# Patient Record
Sex: Female | Born: 1935 | Race: White | Hispanic: No | Smoking: Never smoker
Health system: Southern US, Community
[De-identification: ages and names within clinical notes are randomized; demographics above are authoritative.]

## PROBLEM LIST (undated history)

## (undated) DIAGNOSIS — G629 Polyneuropathy, unspecified: Secondary | ICD-10-CM

## (undated) DIAGNOSIS — R05 Cough: Secondary | ICD-10-CM

## (undated) DIAGNOSIS — R569 Unspecified convulsions: Secondary | ICD-10-CM

## (undated) DIAGNOSIS — I5181 Takotsubo syndrome: Secondary | ICD-10-CM

## (undated) DIAGNOSIS — G40909 Epilepsy, unspecified, not intractable, without status epilepticus: Secondary | ICD-10-CM

## (undated) DIAGNOSIS — K219 Gastro-esophageal reflux disease without esophagitis: Secondary | ICD-10-CM

## (undated) DIAGNOSIS — F419 Anxiety disorder, unspecified: Secondary | ICD-10-CM

## (undated) DIAGNOSIS — I219 Acute myocardial infarction, unspecified: Secondary | ICD-10-CM

## (undated) DIAGNOSIS — I10 Essential (primary) hypertension: Secondary | ICD-10-CM

## (undated) DIAGNOSIS — Z8709 Personal history of other diseases of the respiratory system: Secondary | ICD-10-CM

## (undated) DIAGNOSIS — R059 Cough, unspecified: Secondary | ICD-10-CM

## (undated) DIAGNOSIS — R011 Cardiac murmur, unspecified: Secondary | ICD-10-CM

## (undated) DIAGNOSIS — Z87440 Personal history of urinary (tract) infections: Secondary | ICD-10-CM

## (undated) DIAGNOSIS — D649 Anemia, unspecified: Secondary | ICD-10-CM

## (undated) HISTORY — PX: CATARACT EXTRACTION W/ INTRAOCULAR LENS IMPLANT: SHX1309

## (undated) HISTORY — PX: CHOLECYSTECTOMY: SHX55

## (undated) HISTORY — PX: ABDOMINAL HYSTERECTOMY: SHX81

## (undated) HISTORY — DX: Essential (primary) hypertension: I10

## (undated) HISTORY — PX: BREAST LUMPECTOMY: SHX2

## (undated) HISTORY — DX: Gastro-esophageal reflux disease without esophagitis: K21.9

## (undated) HISTORY — PX: LAPAROSCOPIC ABDOMINAL EXPLORATION: SHX6249

## (undated) HISTORY — DX: Takotsubo syndrome: I51.81

## (undated) HISTORY — DX: Polyneuropathy, unspecified: G62.9

## (undated) HISTORY — DX: Epilepsy, unspecified, not intractable, without status epilepticus: G40.909

## (undated) HISTORY — DX: Anxiety disorder, unspecified: F41.9

## (undated) HISTORY — PX: CARPAL TUNNEL RELEASE: SHX101

## (undated) HISTORY — PX: TONSILLECTOMY: SUR1361

---

## 2000-03-05 ENCOUNTER — Other Ambulatory Visit: Admission: RE | Admit: 2000-03-05 | Discharge: 2000-03-05 | Payer: Self-pay | Admitting: Gastroenterology

## 2000-03-05 ENCOUNTER — Encounter (INDEPENDENT_AMBULATORY_CARE_PROVIDER_SITE_OTHER): Payer: Self-pay | Admitting: Specialist

## 2004-05-23 ENCOUNTER — Ambulatory Visit (HOSPITAL_COMMUNITY): Admission: RE | Admit: 2004-05-23 | Discharge: 2004-05-23 | Payer: Self-pay | Admitting: Gastroenterology

## 2004-06-27 ENCOUNTER — Ambulatory Visit (HOSPITAL_COMMUNITY): Admission: RE | Admit: 2004-06-27 | Discharge: 2004-06-27 | Payer: Self-pay | Admitting: Gastroenterology

## 2004-07-20 ENCOUNTER — Emergency Department (HOSPITAL_COMMUNITY): Admission: EM | Admit: 2004-07-20 | Discharge: 2004-07-20 | Payer: Self-pay | Admitting: Emergency Medicine

## 2005-08-16 IMAGING — CR DG ABDOMEN ACUTE W/ 1V CHEST
3 series · 3 of 3 positions shown · non-contrast
Comparison: none

CLINICAL DATA: Nausea/vomiting, short of breath.
 ACUTE ABDOMINAL SERIES
 The lungs are hyperinflated compatible with COPD.  The lungs are clear.
 Flat and upright views of the abdomen show normal bowel gas pattern without obstruction or free air.  There are no renal calculi.
 IMPRESSION 
 COPD.  No active disease.  
 Negative abdomen.

[view not recorded (1 of 3)]
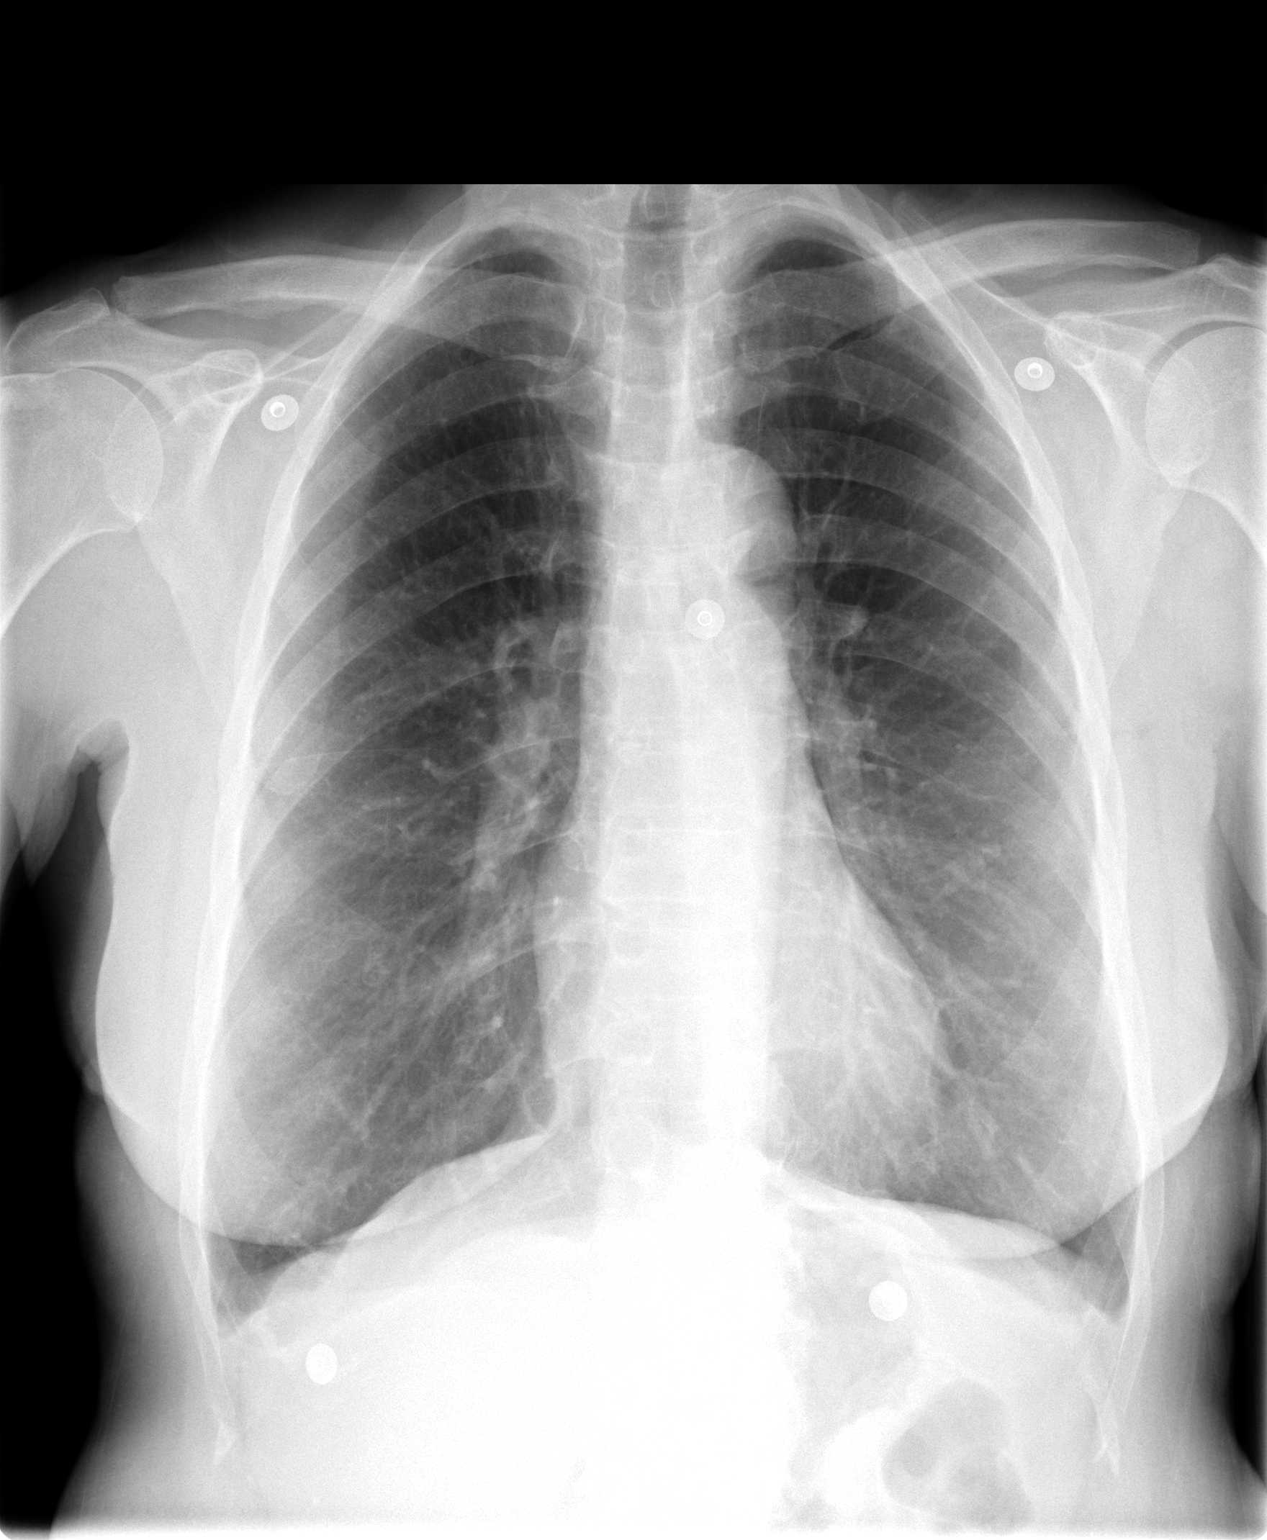

[view not recorded (2 of 3)]
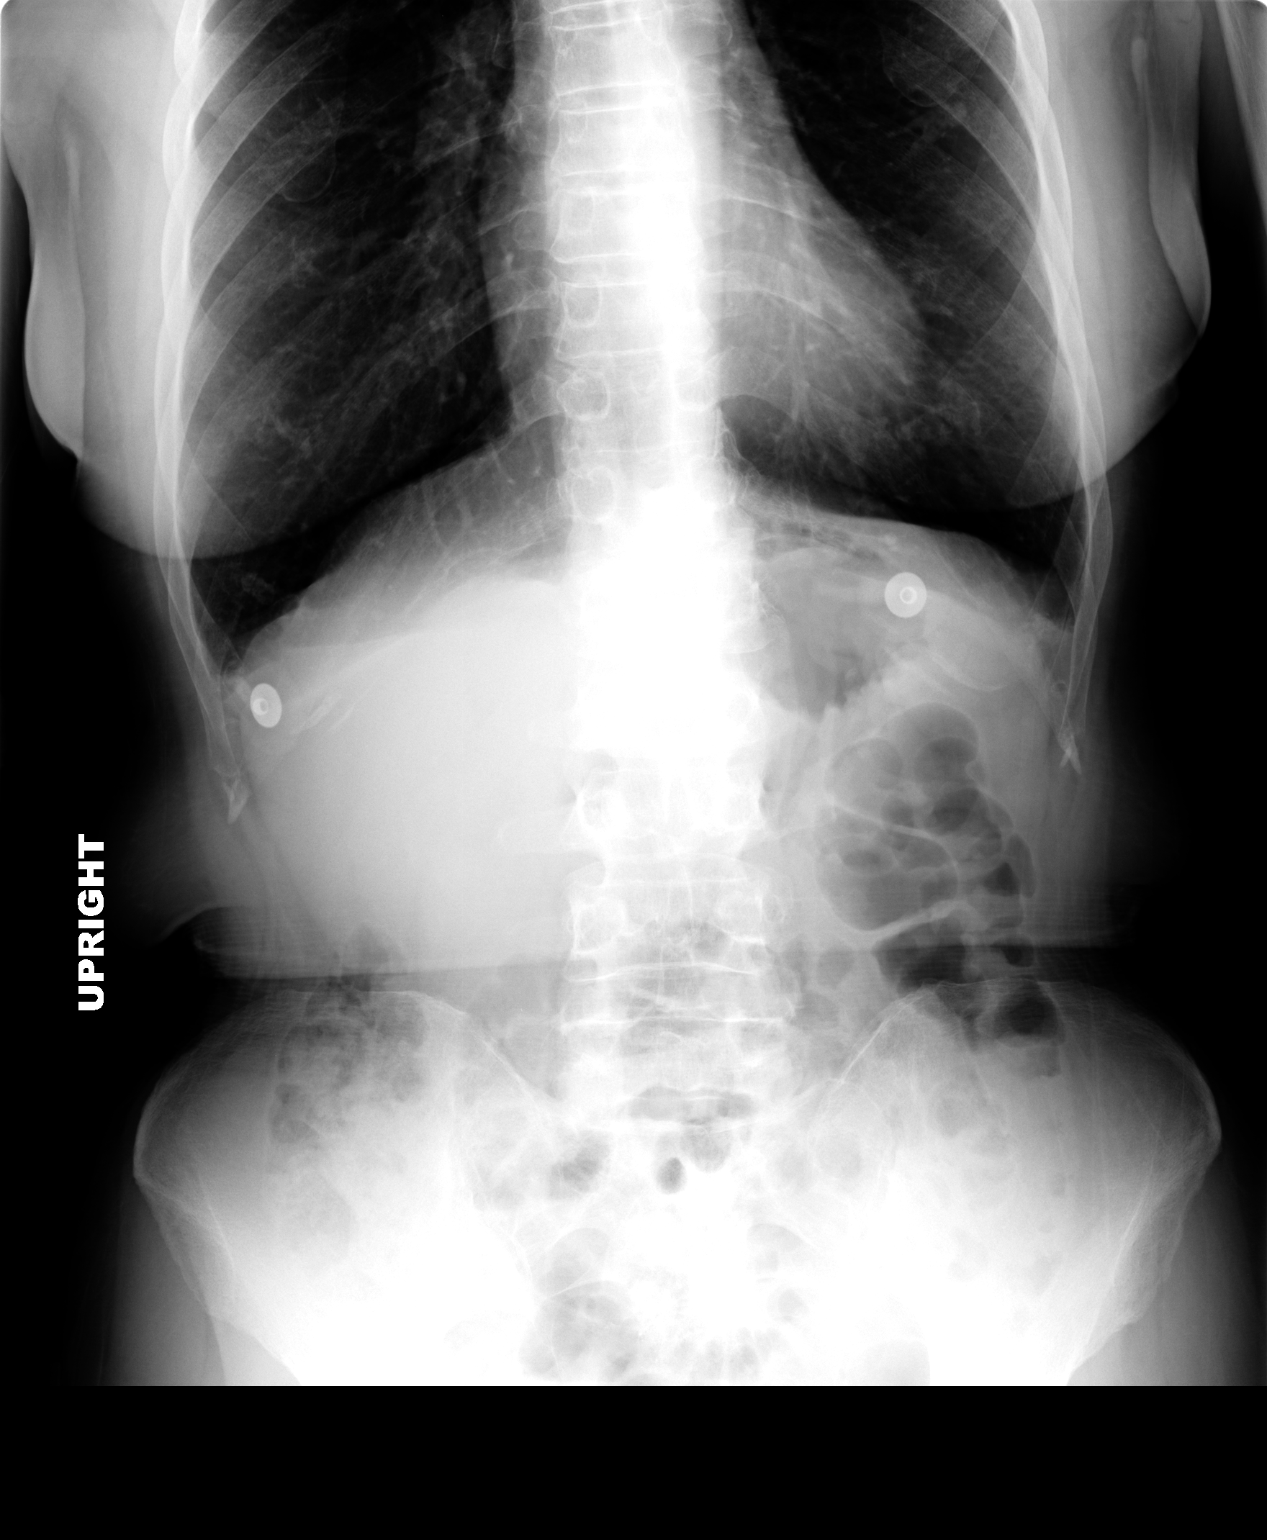

[view not recorded (3 of 3)]
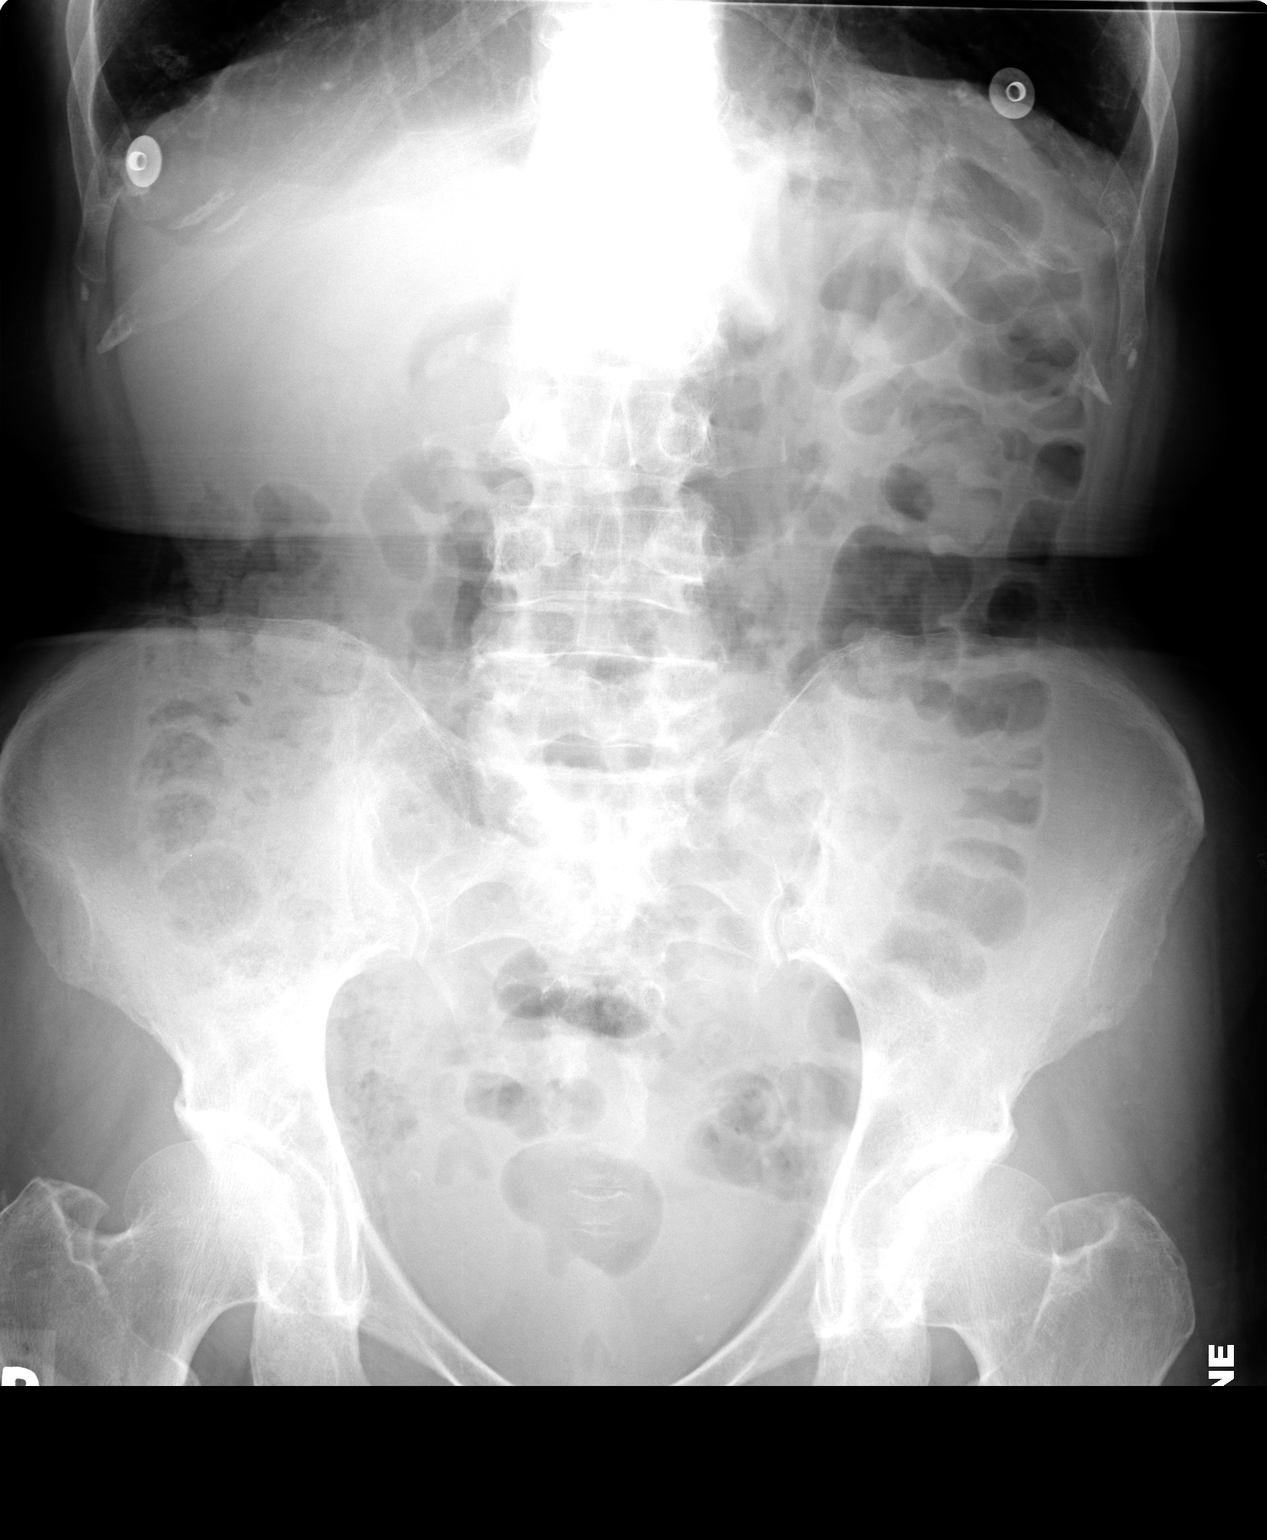

[3 of 3 positions shown; findings below may reference images not displayed]

## 2008-11-12 HISTORY — PX: CARDIAC CATHETERIZATION: SHX172

## 2010-01-20 ENCOUNTER — Ambulatory Visit: Payer: Self-pay | Admitting: Cardiovascular Disease

## 2010-02-09 ENCOUNTER — Ambulatory Visit (HOSPITAL_COMMUNITY): Admission: RE | Admit: 2010-02-09 | Discharge: 2010-02-09 | Payer: Self-pay | Admitting: Cardiovascular Disease

## 2010-02-09 ENCOUNTER — Ambulatory Visit: Payer: Self-pay | Admitting: Cardiology

## 2010-02-09 ENCOUNTER — Encounter: Payer: Self-pay | Admitting: Cardiovascular Disease

## 2010-02-09 ENCOUNTER — Ambulatory Visit: Payer: Self-pay

## 2010-02-15 ENCOUNTER — Telehealth: Payer: Self-pay | Admitting: Cardiovascular Disease

## 2010-02-21 ENCOUNTER — Ambulatory Visit: Payer: Self-pay | Admitting: Cardiovascular Disease

## 2010-03-30 ENCOUNTER — Ambulatory Visit: Payer: Self-pay | Admitting: Cardiovascular Disease

## 2010-05-04 ENCOUNTER — Ambulatory Visit: Payer: Self-pay | Admitting: Cardiovascular Disease

## 2010-08-08 ENCOUNTER — Ambulatory Visit: Payer: Self-pay | Admitting: Cardiovascular Disease

## 2010-12-05 ENCOUNTER — Ambulatory Visit
Admission: RE | Admit: 2010-12-05 | Discharge: 2010-12-05 | Payer: Self-pay | Source: Home / Self Care | Attending: Cardiovascular Disease | Admitting: Cardiovascular Disease

## 2010-12-06 NOTE — Assessment & Plan Note (Signed)
[facility]                       [place] CARDIOLOGY OFFICE NOTE  LAURAN, ROMANSKI                    MRN:          [id number] DATE:12/05/2010                            DOB:          22-May-1927   Mr. [name] is an 77 year old female who is here for followup visit. She has the following problem list: 1. Takotsubo cardiomyopathy in May of 2010.  Ejection fraction has     normalized since then. 2. Hypertension. 3. Seizure disorder. 4. Gastroesophageal reflux disease. 5. Anxiety. 6. Peripheral neuropathy.  INTERVAL HISTORY:  The patient is here today for a followup visit. Overall, she has been doing very well from a cardiac standpoint.  There has been no chest pain, dizziness, syncope, or presyncope.  There has been a concern recently about possibility that her amlodipine is causing constipation.  PHYSICAL EXAMINATION:  VITAL SIGNS:  Weight is 130.6 pounds, blood pressure is 141/71, pulse is 68, oxygen saturation is 95% on room air. NECK:  Reveals no JVD or carotid bruits. LUNGS:  Clear to auscultation. HEART:  Regular rate and rhythm with no gallops or murmurs. ABDOMEN:  Benign, nontender, nondistended. EXTREMITIES:  With no clubbing, cyanosis, or edema.  IMPRESSION: 1. Status post stress-induced cardiomyopathy or Takotsubo syndrome in     May of 2010.  This has resolved since then.  Most recent ejection     fraction was normal.  We will continue with daily aspirin and blood     pressure control. 2. Hypertension:  Blood pressure is reasonably controlled.  There is a     concern that her amlodipine might be causing constipation.  Thus,     we will stop the medication and switch her to carvedilol 3.125 mg     twice daily.  We will continue with Micardis 40 mg once daily.  The     patient will follow up in 6 months from now or earlier if needed.    Lorine Bears, MD Electronically Signed   MA/MedQ  DD: 12/05/2010  DT: 12/06/2010   Job #: [id number]

## 2010-12-14 NOTE — Progress Notes (Signed)
  Phone Note Outgoing Call   Call placed by: Dessie Coma LPN Call placed to: Patient Summary of Call: Patient notified per Dr. Freida Busman, Echo showed heart structure and function was normal.

## 2011-03-27 NOTE — Assessment & Plan Note (Signed)
[facility]                        [place] CARDIOLOGY OFFICE NOTE   GENIFER, LAZENBY                    MRN:          [id number]  DATE:01/20/2010                            DOB:          May 01, 1927    CHIEF COMPLAINT:  Establishing cardiovascular care.   HISTORY OF PRESENT ILLNESS:  Ms. Oddo is an 77 year old white  female, past medical history significant for possible myocardial  infarction in May 2010, seizure disorder, hypertension, GERD, who is  presenting to change cardiovascular care.  The patient states that in  May of 2010, she had a closed head injury after falling and striking her  head on a table.  She did not lose consciousness.  When she reported to  the emergency room, she was told that she had a myocardial infarction.  She underwent a left heart catheterization at [facility] which  showed minor irregularities of the coronaries and apical hypokinesis  that may be secondary to Takotsubo syndrome.  Since that time, the  patient had a grand mal seizure and is currently under treatment by a  neurologist for such.  She states that she has chronic mild tightness in  her chest that does not go away.  She denies any other chest pain or  chest discomfort other than this chronic sensation.  She also endorses  some dyspnea on exertion but denies any PND or orthopnea.  She has had  multiple deaths in family members and is under considerable amount of  stress for this reason.  She is compliant with her medications but  states the desire to decrease the number of medications that she is  taking.   PAST MEDICAL HISTORY:  As above in HPI.   SOCIAL HISTORY:  No tobacco, no alcohol.   FAMILY HISTORY:  Negative for premature coronary artery disease.   ALLERGIES:  PENICILLIN, CODEINE, and SULFA.   MEDICATIONS:  1. Aspirin 162 mg daily.  2. Amlodipine 2.5 mg daily.  3. Micardis 40 mg daily.  4. Paxil.  5. Lyrica.  6.  Ranitidine.  7. Nexium.  8. Ambien.  9. Clonazepam.  10.Mucinex.  11.Premarin 0.3 mg daily.  12.Glucosamine.  13.Keppra.  14.Estrace.  15.Fish oil.  16.Macrodantin.  17.Amitiza.  18.Multivitamin.   REVIEW OF SYSTEMS:  Positive for headaches, lightheadedness, occasional  dizziness, high blood pressure.  Other systems as in HPI, otherwise  negative.   PHYSICAL EXAMINATION:  VITAL SIGNS:  Her blood pressure is 134/67 in the  right arm and 149/80 in the left arm, pulse is 77, and she weighs 124  pounds.  GENERAL:  No acute distress.  HEENT:  Normocephalic, atraumatic.  NECK:  Supple.  There is no JVD.  There are no carotid bruits.  HEART:  Regular rate and rhythm without murmur, rub, or gallop.  LUNGS:  Clear bilaterally.  ABDOMEN:  Soft, nontender, nondistended.  EXTREMITIES:  Without edema.  SKIN:  Warm and dry.  Pulses 2+ bilateral radial and posterior tibial  pulses.  PSYCHIATRIC:  The patient is tearful, but appropriate with normal levels  of insight.  MUSCULOSKELETAL:  The patient has 5/5 bilateral upper  and lower  extremity strength.   EKG taken today in the office demonstrates normal sinus rhythm with  delayed R-wave progression and evidence of an old anteroseptal  infarction.  Review of the patient's heart catheterization as above in  HPI.  Review of the patient's carotid duplex study shows 139% stenosis  bilaterally, that is dated May 2010.  Review of the patient's labs,  BMP  had a sodium of 138, potassium 4.2, chloride 101, CO2 26, BUN 11,  creatinine 0.8, glucose 80, total cholesterol 210, HDL 65, triglyceride  95, LDL 126.   ASSESSMENT AND PLAN:  1. History of myocardial infarction.  I currently do not have labs to      confirm this.  Her heart catheterization in May showed no evidence      of coronary disease, but did show some apical hypokinesis.  An      ejection fraction was not commented upon.  She should continue on      aspirin 162 mg daily.  She is  not having any angina currently.  She      does have some chronic chest tightness that may be related to      anxiety.  The patient has been on Premarin and we recommend she      discuss a trial off the Premarin with her OB/GYN as it likely is      increasing her risk for thrombosis.  2. Shortness of breath.  The patient does endorse some dyspnea on      exertion that may be appropriate for age or maybe related to      systolic or diastolic dysfunction.  We will check a transthoracic      echocardiogram for further evaluation.  3. Hypertension.  We asked that she check her blood pressure twice a      day at home and report this to [place].  She should continue the above      antihypertensives for now.  4. Lipids.  The patient's LDL was 126.  It appears she does not have a      history significant coronary disease and I see no need for statin      therapy at this time.   We will see the patient back after her echocardiogram and her other  medical records arrive.     Brayton El, MD  Electronically Signed    SGA/MedQ  DD: 01/20/2010  DT: 01/21/2010  Job #: [id number]

## 2011-03-27 NOTE — Assessment & Plan Note (Signed)
[place] [facility]                         CARDIOLOGY OFFICE NOTE   [name], [name]                    MRN:          [id number]  DATE:02/21/2010                            DOB:          1926/12/06    PROBLEM LIST:  1. Possible history of a Takotsubo cardiomyopathy, now resolved.  2. Hypertension.  3. Seizure.  4. Gastroesophageal reflux disease.   INTERVAL HISTORY:  The patient states since her last visit she has been  getting along fairly well.  She occasionally gets epigastric discomfort;  however, it is self-limiting.  She has been compliant with her  medications.  Her main complaint seems to be some burning in her mouth.  She was started on Paxil for this symptom and may try Neurontin if the  Paxil does not work.  She is active during the day and is able to  perform all of her daily activities without any significant difficulty.   PHYSICAL EXAMINATION:  VITAL SIGNS:  On physical exam, blood pressure in  the right arm is 133/67, left arm 148/67; pulse is 71, saturating 95% on  room air, she weighs 126 pounds.  GENERAL:  No acute distress.  HEENT:  Normocephalic, atraumatic.  HEART:  Regular rate and rhythm without murmur, rub, or gallop.  LUNGS:  Clear bilaterally.  ABDOMEN:  Soft, nontender, nondistended.  EXTREMITIES:  Without edema.  SKIN:  Warm and dry.   Review of the patient's echocardiogram report dated February 09, 2010  ejection fraction of 55-60%.  There were no regional wall motion  abnormalities.  There was diastolic dysfunction, mitral valve prolapse  along the anterior and posterior leaflet per the report associated with  mild regurgitation.   ASSESSMENT AND PLAN:  1. History of Takotsubo.  She currently has normal ejection fraction      and normal wall motion.  She also does not having any signs of      heart failure.  She should continue on Micardis and Nexium.  Of      note, she had a heart catheterization around  the time of Takotsubo      which showed no obstructive coronary disease.  This was on Mar 14, 2009.  The patient was told that she had a myocardial infarction      around this time and it is possible she had a small troponin bump      from the Takotsubo.  She should continue on aspirin dose of least      81 mg daily.  The patient can followup in 6 months' time.  2. Hypertension.  Blood pressure minimally elevated today.  The      patient will bring in her blood pressure log from home, first      evaluate, she may need titration up from Micardis or the      amlodipine.  3. Lipids.  Her lipid profile was acceptable since she has no      significant coronary disease and      LDL of 126, HDL 65, triglyceride 95, total cholesterol of 210.  The  patient may try red yeast rice at home.     Brayton El, MD  Electronically Signed    SGA/MedQ  DD: 02/21/2010  DT: 02/22/2010  Job #: [id number]

## 2011-03-27 NOTE — Assessment & Plan Note (Signed)
[facility]                        [place] CARDIOLOGY OFFICE NOTE   [name], [name]                    MRN:          [id number]  DATE:05/04/2010                            DOB:          July 13, 1927    PROBLEMS LIST:  1. Possible history of Takotsubo cardiomyopathy, now resolved.  2. Hypertension.  3. Seizure.  4. Gastroesophageal reflux disease.   INTERVAL HISTORY:  The patient comes in today with 2 episodes of chest  discomfort.  She states that approximately 1 week and half ago, she had  a acute onset of sharp chest discomfort lasting only seconds without  radiation or associated symptoms.  It was self-limiting.  Approximately  10 days after that episode, she had a subsequent episode again sharp in  nature, lasting only a few seconds without radiation or associated  symptoms.  Other than that, she has been in her normal state of health.  She continues to endorse some burning in her lower extremities for which  she is being treated with Lyrica.  She states that the burning in her  mouth has improved since the Benadryl, nystatin, lidocaine mixture we  prescribed at her last visit.  The patient's daughter does endorse one  episode of the patient repeating herself several times in a row 1 day,  but this has not reoccurred.   PHYSICAL EXAMINATION:  VITAL SIGNS:  Her blood pressure is 146/65, pulse  69, satting 96% on room air, and she weighs 130 pounds.  GENERAL:  No acute distress.  HEENT:  Normocephalic, atraumatic.  NECK:  Supple.  HEART:  Regular rate and rhythm without murmur, rub, or gallop.  LUNGS:  Clear bilaterally.  ABDOMEN:  Soft, nontender, nondistended.  EXTREMITIES:  Without edema.  SKIN:  Cool and dry.   Review of the patient's blood pressure log from home show systolic blood  pressures generally in the high 130s, low 140s, and diastolic blood  pressures ranging from 71-87.  Her heart rate ranging from 59-70.   ASSESSMENT  AND PLAN:  At this point, I am not overly concerned with the  patient's chest discomfort.  It has only occurred twice and has lasted  only a few seconds each time.  The patient is told that if the episodes  were to become more frequent, more intense, or the quality of them were  to change, she should contact our office and we would probably proceed  with a CT scan of the chest to rule out any type of potential vascular  dissection or pulmonary embolism.  Reviewing the patient's blood  pressure log, her blood pressure seems to be at an ideal level  especially considering the fact that she has orthostatic hypotension.  Her blood pressure medication should continue as before.  Lastly, if the  patient has anymore episodes of  forgetfulness or repeating herself, they we will contact her primary  care physician Dr. Sherral Hammers for further evaluation of this issue.  Today,  we will refill her swish and spit prescription.     Brayton El, MD  Electronically Signed    SGA/MedQ  DD: 05/04/2010  DT:  05/05/2010  Job #: [id number]   cc:   Keturah Barre, MD

## 2011-03-27 NOTE — Assessment & Plan Note (Signed)
[place] [facility]                        [place] CARDIOLOGY OFFICE NOTE   STACY, SAILER                    MRN:          [id number]  DATE:03/30/2010                            DOB:          12-Mar-1927    PROBLEM LIST:  1. Possible history of takotsubo cardiomyopathy now resolved.  2. Hypertension.  3. Seizure.  4. GERD.   INTERVAL HISTORY:  The patient comes in today complaining of worsening  in dizziness, especially when she stands.  She also is complaining of  worsening of burning in her mouth.  She has noticed no fever blisters.  The patient states the dizziness occurs generally when she stands or if  she moves her head up and leans back even a little bit.  She denies any  chest discomfort and has been compliant with her other medicines.  She  brings in a blood pressure log today that shows her blood pressures at  home have been well controlled often with systolics in the 120s or 110s.   PHYSICAL EXAMINATION:  VITAL SIGNS:  Today, her blood pressure is  155/76, pulse 64, satting 96% on room air, and weigh is 127 pounds.  Orthostatics taken today in the office, initially heart rate was 69,  blood pressure 182/80.  Upon standing, heart rates stayed at 69, blood  pressure decreased to 149/72.  At 5 minutes blood pressure increased to  154/79, heart rate maintaining at 67.  GENERAL:  No acute distress.  HEENT:  Normocephalic, atraumatic.  NECK:  Supple.  HEART:  Regular rate and rhythm without murmur, rub, or gallop.  LUNGS:  Clear bilaterally.  ABDOMEN:  Soft, nontender, nondistended.  EXTREMITIES:  Without edema.  SKIN:  Warm and dry.   ASSESSMENT AND PLAN:  1. Dizziness.  The patient is encouraged to increase her p.o. intake.      Orthostasis may be playing a role; however, her blood pressure      remains elevated.  For now, she will continue on the Micardis and      amlodipine.  She will bring in her home blood pressure monitoring    device at some point so we can calibrate it with our machine to      assure it is accurate.  She will also take her blood pressures at      home consistently twice a day for several days and let us know what      this shows.  In the future, it may be necessary to stop the      amlodipine if indeed her blood pressure is as well controlled as      her current, intermittent log demonstrates.  2. Mouth burning.  She has seen several dentists for this in the past      and has not been able to find an effective treatment for this.  On      exam today, her dentures are in place but there is no clear thrush      or lesion.  I have called in a prescription of Magic mouthwash and      have  specifically asked for Benadryl, nystatin, and lidocaine to be      placed in the solution.  She should use at 5 mL swish and spit      twice a day or more frequently as needed.     Brayton El, MD  Electronically Signed    SGA/MedQ  DD: 03/30/2010  DT: 03/31/2010  Job #: [id number]   cc:   Keturah Barre, M.D.

## 2011-03-27 NOTE — Assessment & Plan Note (Signed)
[name]                        [place] CARDIOLOGY OFFICE NOTE   [name], [name]                    MRN:          [id number]  DATE:08/08/2010                            DOB:          12-Dec-1926    This is a followup visit for [name] who is an 77 year old female  with the following problem list:  1. Takotsubo cardiomyopathy in May 2010.  Her ejection fraction has      normalized after that.  2. Hypertension.  3. Seizure.  4. Gastroesophageal reflux disease.   INTERVAL HISTORY:  The patient continues to have brief, but rare  episodes of intermittent chest discomfort lasting for a few seconds.  She has chronic exertional dyspnea.  She has multiple other complaints  that include numbness in her feet and hands.  Her blood pressure has  been well controlled.  She has not had any new seizure activities.  She  is on multiple medications for chronic medical conditions.   PHYSICAL EXAMINATION:  VITAL SIGNS:  Weight is 125.2 pounds, blood  pressure is 129/77, pulse is 70, oxygen saturation 97% on room air.  NECK:  No JVD or carotid bruits.  LUNGS:  Clear to auscultation.  HEART:  Regular rate and rhythm with no gallops or murmurs.  ABDOMEN:  Benign, nontender, nondistended.  EXTREMITIES:  No clubbing, cyanosis or edema.   1. Status post stress-induced cardiomyopathy or takotsubo syndrome      which happened in May 2010.  I did review some of the images of her      cardiac catheterization.  Her left ventriculogram was classic for      takotsubo cardiomyopathy.  It appears that her coronary arteries      were normal.  She did have a followup echocardiogram since then      which showed normalization of her LV systolic function.  At this      time, I recommend continuing daily aspirin and blood pressure      control.  She does have a lot of anxiety symptoms which is being      treated.  I think the precipitating factor last year was illness  of      her son who was battling long-term colon cancer.  He actually died      last year.  I asked her to discuss if she can come off Premarin      given her cardiac history.  2. Hypertension.  Blood pressure is well controlled.  We will continue      with Micardis and amlodipine.  3. Social issues.  The patient seems to be under stress as she is      going to be living by herself in the near future.  Her daughter      will be moving out when her kids start school.  She does not want      to consider an assisted-living situation.  We will continue to      monitor.  She will follow up with me in 4 months from now or      earlier if needed.  Lorine Bears, MD  Electronically Signed   MA/MedQ  DD: 08/08/2010  DT: 08/09/2010  Job #: [id number]

## 2011-05-31 ENCOUNTER — Encounter: Payer: Self-pay | Admitting: Cardiovascular Disease

## 2011-06-04 ENCOUNTER — Ambulatory Visit (INDEPENDENT_AMBULATORY_CARE_PROVIDER_SITE_OTHER): Payer: Medicare Other | Admitting: Cardiovascular Disease

## 2011-06-04 ENCOUNTER — Ambulatory Visit: Payer: Self-pay | Admitting: Cardiovascular Disease

## 2011-06-04 ENCOUNTER — Encounter: Payer: Self-pay | Admitting: Cardiovascular Disease

## 2011-06-04 DIAGNOSIS — I10 Essential (primary) hypertension: Secondary | ICD-10-CM | POA: Insufficient documentation

## 2011-06-04 DIAGNOSIS — I5181 Takotsubo syndrome: Secondary | ICD-10-CM

## 2011-06-04 NOTE — Progress Notes (Signed)
HPI  This is an 77 year old female who is here today for followup visit. She has a history of stress-induced cardiomyopathy in 2010. At that time cardiac catheterization showed no significant coronary artery disease. During her last visit, I switched her from amlodipine to carvedilol due to concerns about constipation. She has been doing reasonably well. She gets occasional chest pain at rest for a few seconds. She has chronic exertional dyspnea. There has been no palpitations, syncope or presyncope.   Allergies  Allergen Reactions  . Codeine   . Penicillins   . Sulfa Antibiotics      Current Outpatient Prescriptions on File Prior to Visit  Medication Sig Dispense Refill  . acetaminophen (TYLENOL) 500 MG tablet Take 500 mg by mouth every 6 (six) hours as needed.        Marland Kitchen aspirin 81 MG tablet Take 162 mg by mouth daily.        . bethanechol (URECHOLINE) 25 MG tablet Take 12.5 mg by mouth daily.        . clonazePAM (KLONOPIN) 0.5 MG tablet Take 0.5 mg by mouth 2 (two) times daily as needed.        . clotrimazole-betamethasone (LOTRISONE) cream Apply topically 2 (two) times daily.        Marland Kitchen esomeprazole (NEXIUM) 40 MG capsule Take 40 mg by mouth daily before breakfast.        . estradiol (ESTRACE) 0.1 MG/GM vaginal cream Place 2 g vaginally daily.        . fish oil-omega-3 fatty acids 1000 MG capsule Take 2 g by mouth daily.        . Glucosamine-Chondroit-Vit C-Mn (GLUCOSAMINE 1500 COMPLEX PO) Take by mouth 2 (two) times daily.        . hydrocortisone (ANUSOL-HC) 2.5 % rectal cream Place rectally as needed.        . levETIRAcetam (KEPPRA) 750 MG tablet Take 750 mg by mouth every 12 (twelve) hours.        Marland Kitchen lubiprostone (AMITIZA) 24 MCG capsule Take 24 mcg by mouth 2 (two) times daily with a meal.        . Multiple Vitamin (MULTIVITAMIN) tablet Take 1 tablet by mouth daily.        . Multiple Vitamins-Minerals (VISION-VITE PRESERVE PO) Take by mouth daily.        . nitrofurantoin (MACRODANTIN)  50 MG capsule Take 50 mg by mouth at bedtime.        Marland Kitchen PARoxetine (PAXIL) 10 MG tablet Take 10 mg by mouth every morning.        . pregabalin (LYRICA) 50 MG capsule Take 50 mg by mouth 3 (three) times daily.        . Probiotic Product (PROBIOTIC PO) Take by mouth 2 (two) times daily.        . ranitidine (ZANTAC) 300 MG capsule Take 300 mg by mouth as needed.        . silodosin (RAPAFLO) 4 MG CAPS capsule Take 8 mg by mouth daily with breakfast.        . telmisartan (MICARDIS) 40 MG tablet Take 40 mg by mouth daily.        Marland Kitchen zolpidem (AMBIEN CR) 6.25 MG CR tablet Take 6.25 mg by mouth at bedtime as needed.        Marland Kitchen amLODipine (NORVASC) 2.5 MG tablet Take 2.5 mg by mouth daily.           Past Medical History  Diagnosis Date  . Takotsubo cardiomyopathy   .  Seizure disorder   . GERD (gastroesophageal reflux disease)   . Anxiety   . Peripheral neuropathy   . HTN (hypertension)      Past Surgical History  Procedure Date  . Cardiac catheterization 2010    No significant CAD. stress induced cardiomyopathy.      Family History  Problem Relation Age of Onset  . Heart failure    . Heart attack    . Cancer       History   Social History  . Marital Status: Widowed    Spouse Name: N/A    Number of Children: 3  . Years of Education: N/A   Occupational History  . retired    Social History Main Topics  . Smoking status: Never Smoker   . Smokeless tobacco: Not on file  . Alcohol Use: No  . Drug Use: No  . Sexually Active: Not on file   Other Topics Concern  . Not on file   Social History Narrative  . No narrative on file       PHYSICAL EXAM   BP 115/65  Pulse 64  Ht 5' (1.524 m)  Wt 123 lb (55.792 kg)  BMI 24.02 kg/m2  SpO2 96%  Constitutional: She is oriented to person, place, and time. She appears well-developed and well-nourished. No distress.  HENT: No nasal discharge.  Head: Normocephalic and atraumatic.  Eyes: Pupils are equal, round, and reactive to  light. Right eye exhibits no discharge. Left eye exhibits no discharge.  Neck: Normal range of motion. Neck supple. No JVD present. No thyromegaly present.  Cardiovascular: Normal rate, regular rhythm, normal heart sounds and intact distal pulses. Exam reveals no gallop and no friction rub.  No murmur heard.  Pulmonary/Chest: Effort normal and breath sounds normal. No stridor. No respiratory distress. She has no wheezes. She has no rales. She exhibits no tenderness.  Abdominal: Soft. Bowel sounds are normal. She exhibits no distension. There is no tenderness. There is no rebound and no guarding.  Musculoskeletal: Normal range of motion. She exhibits no edema and no tenderness.  Neurological: She is alert and oriented to person, place, and time. Coordination normal.  Skin: Skin is warm and dry. No rash noted. She is not diaphoretic. No erythema. No pallor.  Psychiatric: She has a normal mood and affect. Her behavior is normal. Judgment and thought content normal.     EKG: Normal sinus rhythm with no significant ST or T wave changes.   ASSESSMENT AND PLAN

## 2011-06-04 NOTE — Assessment & Plan Note (Signed)
Her most recent ejection fraction was normal. Continue small dose carvedilol as well as Micardis.

## 2011-06-04 NOTE — Assessment & Plan Note (Signed)
Her blood pressure is well controlled. Continue current medications. 

## 2011-06-07 ENCOUNTER — Encounter: Payer: Self-pay | Admitting: Cardiovascular Disease

## 2011-06-07 ENCOUNTER — Other Ambulatory Visit: Payer: Self-pay | Admitting: *Deleted

## 2011-06-07 DIAGNOSIS — I5181 Takotsubo syndrome: Secondary | ICD-10-CM

## 2011-06-07 DIAGNOSIS — I10 Essential (primary) hypertension: Secondary | ICD-10-CM

## 2011-06-25 ENCOUNTER — Other Ambulatory Visit: Payer: Self-pay | Admitting: *Deleted

## 2011-06-25 MED ORDER — CARVEDILOL 3.125 MG PO TABS: 3.1250 mg | ORAL_TABLET | Freq: Two times a day (BID) | ORAL | Status: DC

## 2011-11-19 DIAGNOSIS — M1990 Unspecified osteoarthritis, unspecified site: Secondary | ICD-10-CM | POA: Diagnosis not present

## 2011-11-19 DIAGNOSIS — M899 Disorder of bone, unspecified: Secondary | ICD-10-CM | POA: Diagnosis not present

## 2011-11-19 DIAGNOSIS — M949 Disorder of cartilage, unspecified: Secondary | ICD-10-CM | POA: Diagnosis not present

## 2011-11-22 DIAGNOSIS — I2510 Atherosclerotic heart disease of native coronary artery without angina pectoris: Secondary | ICD-10-CM | POA: Diagnosis not present

## 2011-11-22 DIAGNOSIS — E785 Hyperlipidemia, unspecified: Secondary | ICD-10-CM | POA: Diagnosis not present

## 2011-11-22 DIAGNOSIS — I10 Essential (primary) hypertension: Secondary | ICD-10-CM | POA: Diagnosis not present

## 2011-11-22 DIAGNOSIS — K219 Gastro-esophageal reflux disease without esophagitis: Secondary | ICD-10-CM | POA: Diagnosis not present

## 2011-12-04 DIAGNOSIS — I2510 Atherosclerotic heart disease of native coronary artery without angina pectoris: Secondary | ICD-10-CM | POA: Diagnosis not present

## 2011-12-04 DIAGNOSIS — E785 Hyperlipidemia, unspecified: Secondary | ICD-10-CM | POA: Diagnosis not present

## 2011-12-04 DIAGNOSIS — I10 Essential (primary) hypertension: Secondary | ICD-10-CM | POA: Diagnosis not present

## 2011-12-04 DIAGNOSIS — I428 Other cardiomyopathies: Secondary | ICD-10-CM | POA: Diagnosis not present

## 2011-12-04 DIAGNOSIS — M79609 Pain in unspecified limb: Secondary | ICD-10-CM | POA: Diagnosis not present

## 2011-12-06 DIAGNOSIS — N390 Urinary tract infection, site not specified: Secondary | ICD-10-CM | POA: Diagnosis not present

## 2011-12-20 ENCOUNTER — Other Ambulatory Visit: Payer: Self-pay | Admitting: Cardiovascular Disease

## 2011-12-20 DIAGNOSIS — M79609 Pain in unspecified limb: Secondary | ICD-10-CM | POA: Diagnosis not present

## 2011-12-20 DIAGNOSIS — R5381 Other malaise: Secondary | ICD-10-CM | POA: Diagnosis not present

## 2011-12-20 DIAGNOSIS — I428 Other cardiomyopathies: Secondary | ICD-10-CM | POA: Diagnosis not present

## 2011-12-20 DIAGNOSIS — E785 Hyperlipidemia, unspecified: Secondary | ICD-10-CM | POA: Diagnosis not present

## 2011-12-20 DIAGNOSIS — I10 Essential (primary) hypertension: Secondary | ICD-10-CM | POA: Diagnosis not present

## 2011-12-20 DIAGNOSIS — I2510 Atherosclerotic heart disease of native coronary artery without angina pectoris: Secondary | ICD-10-CM | POA: Diagnosis not present

## 2012-01-03 DIAGNOSIS — N390 Urinary tract infection, site not specified: Secondary | ICD-10-CM | POA: Diagnosis not present

## 2012-01-06 DIAGNOSIS — J018 Other acute sinusitis: Secondary | ICD-10-CM | POA: Diagnosis not present

## 2012-01-22 DIAGNOSIS — H353190 Nonexudative age-related macular degeneration, unspecified eye, stage unspecified: Secondary | ICD-10-CM | POA: Diagnosis not present

## 2012-02-07 DIAGNOSIS — R51 Headache: Secondary | ICD-10-CM | POA: Diagnosis not present

## 2012-02-07 DIAGNOSIS — J209 Acute bronchitis, unspecified: Secondary | ICD-10-CM | POA: Diagnosis not present

## 2012-02-20 DIAGNOSIS — N8112 Cystocele, lateral: Secondary | ICD-10-CM | POA: Diagnosis not present

## 2012-02-20 DIAGNOSIS — N951 Menopausal and female climacteric states: Secondary | ICD-10-CM | POA: Diagnosis not present

## 2012-03-11 DIAGNOSIS — J209 Acute bronchitis, unspecified: Secondary | ICD-10-CM | POA: Diagnosis not present

## 2012-03-11 DIAGNOSIS — J309 Allergic rhinitis, unspecified: Secondary | ICD-10-CM | POA: Diagnosis not present

## 2012-04-17 DIAGNOSIS — J309 Allergic rhinitis, unspecified: Secondary | ICD-10-CM | POA: Diagnosis not present

## 2012-04-17 DIAGNOSIS — J209 Acute bronchitis, unspecified: Secondary | ICD-10-CM | POA: Diagnosis not present

## 2012-04-17 DIAGNOSIS — Z23 Encounter for immunization: Secondary | ICD-10-CM | POA: Diagnosis not present

## 2012-05-01 DIAGNOSIS — G5600 Carpal tunnel syndrome, unspecified upper limb: Secondary | ICD-10-CM | POA: Diagnosis not present

## 2012-05-01 DIAGNOSIS — G40309 Generalized idiopathic epilepsy and epileptic syndromes, not intractable, without status epilepticus: Secondary | ICD-10-CM | POA: Diagnosis not present

## 2012-05-01 DIAGNOSIS — K146 Glossodynia: Secondary | ICD-10-CM | POA: Diagnosis not present

## 2012-05-21 DIAGNOSIS — J018 Other acute sinusitis: Secondary | ICD-10-CM | POA: Diagnosis not present

## 2012-05-21 DIAGNOSIS — H1045 Other chronic allergic conjunctivitis: Secondary | ICD-10-CM | POA: Diagnosis not present

## 2012-06-02 DIAGNOSIS — H353190 Nonexudative age-related macular degeneration, unspecified eye, stage unspecified: Secondary | ICD-10-CM | POA: Diagnosis not present

## 2012-06-02 DIAGNOSIS — H35379 Puckering of macula, unspecified eye: Secondary | ICD-10-CM | POA: Diagnosis not present

## 2012-06-18 DIAGNOSIS — H60399 Other infective otitis externa, unspecified ear: Secondary | ICD-10-CM | POA: Diagnosis not present

## 2012-06-18 DIAGNOSIS — J309 Allergic rhinitis, unspecified: Secondary | ICD-10-CM | POA: Diagnosis not present

## 2012-06-30 DIAGNOSIS — I2510 Atherosclerotic heart disease of native coronary artery without angina pectoris: Secondary | ICD-10-CM | POA: Diagnosis not present

## 2012-06-30 DIAGNOSIS — R5381 Other malaise: Secondary | ICD-10-CM | POA: Diagnosis not present

## 2012-06-30 DIAGNOSIS — I428 Other cardiomyopathies: Secondary | ICD-10-CM | POA: Diagnosis not present

## 2012-06-30 DIAGNOSIS — E785 Hyperlipidemia, unspecified: Secondary | ICD-10-CM | POA: Diagnosis not present

## 2012-07-04 DIAGNOSIS — J018 Other acute sinusitis: Secondary | ICD-10-CM | POA: Diagnosis not present

## 2012-07-04 DIAGNOSIS — H6120 Impacted cerumen, unspecified ear: Secondary | ICD-10-CM | POA: Diagnosis not present

## 2012-07-13 DIAGNOSIS — R6884 Jaw pain: Secondary | ICD-10-CM | POA: Diagnosis not present

## 2012-07-30 DIAGNOSIS — R339 Retention of urine, unspecified: Secondary | ICD-10-CM | POA: Diagnosis not present

## 2012-07-30 DIAGNOSIS — N952 Postmenopausal atrophic vaginitis: Secondary | ICD-10-CM | POA: Diagnosis not present

## 2012-07-30 DIAGNOSIS — N3090 Cystitis, unspecified without hematuria: Secondary | ICD-10-CM | POA: Diagnosis not present

## 2012-08-23 DIAGNOSIS — Z23 Encounter for immunization: Secondary | ICD-10-CM | POA: Diagnosis not present

## 2012-09-17 DIAGNOSIS — E782 Mixed hyperlipidemia: Secondary | ICD-10-CM | POA: Diagnosis not present

## 2012-09-17 DIAGNOSIS — I10 Essential (primary) hypertension: Secondary | ICD-10-CM | POA: Diagnosis not present

## 2012-09-17 DIAGNOSIS — M2550 Pain in unspecified joint: Secondary | ICD-10-CM | POA: Diagnosis not present

## 2012-09-17 DIAGNOSIS — G5600 Carpal tunnel syndrome, unspecified upper limb: Secondary | ICD-10-CM | POA: Diagnosis not present

## 2012-09-17 DIAGNOSIS — J069 Acute upper respiratory infection, unspecified: Secondary | ICD-10-CM | POA: Diagnosis not present

## 2012-10-22 DIAGNOSIS — J209 Acute bronchitis, unspecified: Secondary | ICD-10-CM | POA: Diagnosis not present

## 2012-11-25 DIAGNOSIS — K589 Irritable bowel syndrome without diarrhea: Secondary | ICD-10-CM | POA: Diagnosis not present

## 2012-11-25 DIAGNOSIS — K219 Gastro-esophageal reflux disease without esophagitis: Secondary | ICD-10-CM | POA: Diagnosis not present

## 2013-01-06 DIAGNOSIS — G5600 Carpal tunnel syndrome, unspecified upper limb: Secondary | ICD-10-CM | POA: Diagnosis not present

## 2013-01-19 DIAGNOSIS — G5600 Carpal tunnel syndrome, unspecified upper limb: Secondary | ICD-10-CM | POA: Diagnosis not present

## 2013-01-28 DIAGNOSIS — R339 Retention of urine, unspecified: Secondary | ICD-10-CM | POA: Diagnosis not present

## 2013-01-28 DIAGNOSIS — H353290 Exudative age-related macular degeneration, unspecified eye, stage unspecified: Secondary | ICD-10-CM | POA: Diagnosis not present

## 2013-01-28 DIAGNOSIS — N952 Postmenopausal atrophic vaginitis: Secondary | ICD-10-CM | POA: Diagnosis not present

## 2013-02-03 DIAGNOSIS — H348392 Tributary (branch) retinal vein occlusion, unspecified eye, stable: Secondary | ICD-10-CM | POA: Diagnosis not present

## 2013-02-04 DIAGNOSIS — Z4789 Encounter for other orthopedic aftercare: Secondary | ICD-10-CM | POA: Diagnosis not present

## 2013-02-04 DIAGNOSIS — G5600 Carpal tunnel syndrome, unspecified upper limb: Secondary | ICD-10-CM | POA: Diagnosis not present

## 2013-02-06 DIAGNOSIS — N390 Urinary tract infection, site not specified: Secondary | ICD-10-CM | POA: Diagnosis not present

## 2013-02-09 DIAGNOSIS — J209 Acute bronchitis, unspecified: Secondary | ICD-10-CM | POA: Diagnosis not present

## 2013-02-09 DIAGNOSIS — H6120 Impacted cerumen, unspecified ear: Secondary | ICD-10-CM | POA: Diagnosis not present

## 2013-02-14 DIAGNOSIS — N390 Urinary tract infection, site not specified: Secondary | ICD-10-CM | POA: Diagnosis not present

## 2013-02-14 DIAGNOSIS — N3000 Acute cystitis without hematuria: Secondary | ICD-10-CM | POA: Diagnosis not present

## 2013-02-25 DIAGNOSIS — N3000 Acute cystitis without hematuria: Secondary | ICD-10-CM | POA: Diagnosis not present

## 2013-02-25 DIAGNOSIS — H60399 Other infective otitis externa, unspecified ear: Secondary | ICD-10-CM | POA: Diagnosis not present

## 2013-03-02 DIAGNOSIS — N3000 Acute cystitis without hematuria: Secondary | ICD-10-CM | POA: Diagnosis not present

## 2013-03-13 DIAGNOSIS — I10 Essential (primary) hypertension: Secondary | ICD-10-CM | POA: Diagnosis not present

## 2013-03-13 DIAGNOSIS — E785 Hyperlipidemia, unspecified: Secondary | ICD-10-CM | POA: Diagnosis not present

## 2013-03-13 DIAGNOSIS — I428 Other cardiomyopathies: Secondary | ICD-10-CM | POA: Diagnosis not present

## 2013-03-27 DIAGNOSIS — K5900 Constipation, unspecified: Secondary | ICD-10-CM | POA: Diagnosis not present

## 2013-03-27 DIAGNOSIS — Z124 Encounter for screening for malignant neoplasm of cervix: Secondary | ICD-10-CM | POA: Diagnosis not present

## 2013-03-27 DIAGNOSIS — N905 Atrophy of vulva: Secondary | ICD-10-CM | POA: Diagnosis not present

## 2013-04-08 DIAGNOSIS — I10 Essential (primary) hypertension: Secondary | ICD-10-CM | POA: Diagnosis not present

## 2013-04-08 DIAGNOSIS — I428 Other cardiomyopathies: Secondary | ICD-10-CM | POA: Diagnosis not present

## 2013-04-10 DIAGNOSIS — Z1231 Encounter for screening mammogram for malignant neoplasm of breast: Secondary | ICD-10-CM | POA: Diagnosis not present

## 2013-05-08 DIAGNOSIS — H6120 Impacted cerumen, unspecified ear: Secondary | ICD-10-CM | POA: Diagnosis not present

## 2013-05-13 DIAGNOSIS — H35379 Puckering of macula, unspecified eye: Secondary | ICD-10-CM | POA: Diagnosis not present

## 2013-06-16 DIAGNOSIS — G5600 Carpal tunnel syndrome, unspecified upper limb: Secondary | ICD-10-CM | POA: Diagnosis not present

## 2013-06-22 DIAGNOSIS — Z4789 Encounter for other orthopedic aftercare: Secondary | ICD-10-CM | POA: Diagnosis not present

## 2013-06-22 DIAGNOSIS — G5600 Carpal tunnel syndrome, unspecified upper limb: Secondary | ICD-10-CM | POA: Diagnosis not present

## 2013-06-24 DIAGNOSIS — K146 Glossodynia: Secondary | ICD-10-CM | POA: Diagnosis not present

## 2013-06-24 DIAGNOSIS — G5600 Carpal tunnel syndrome, unspecified upper limb: Secondary | ICD-10-CM | POA: Diagnosis not present

## 2013-06-24 DIAGNOSIS — G450 Vertebro-basilar artery syndrome: Secondary | ICD-10-CM | POA: Diagnosis not present

## 2013-06-24 DIAGNOSIS — G40309 Generalized idiopathic epilepsy and epileptic syndromes, not intractable, without status epilepticus: Secondary | ICD-10-CM | POA: Diagnosis not present

## 2013-07-01 DIAGNOSIS — G5600 Carpal tunnel syndrome, unspecified upper limb: Secondary | ICD-10-CM | POA: Diagnosis not present

## 2013-07-15 DIAGNOSIS — G5600 Carpal tunnel syndrome, unspecified upper limb: Secondary | ICD-10-CM | POA: Diagnosis not present

## 2013-08-05 DIAGNOSIS — R300 Dysuria: Secondary | ICD-10-CM | POA: Diagnosis not present

## 2013-08-05 DIAGNOSIS — R339 Retention of urine, unspecified: Secondary | ICD-10-CM | POA: Diagnosis not present

## 2013-08-05 DIAGNOSIS — N952 Postmenopausal atrophic vaginitis: Secondary | ICD-10-CM | POA: Diagnosis not present

## 2013-08-24 DIAGNOSIS — H35379 Puckering of macula, unspecified eye: Secondary | ICD-10-CM | POA: Diagnosis not present

## 2013-09-16 DIAGNOSIS — F411 Generalized anxiety disorder: Secondary | ICD-10-CM | POA: Diagnosis not present

## 2013-09-16 DIAGNOSIS — I10 Essential (primary) hypertension: Secondary | ICD-10-CM | POA: Diagnosis not present

## 2013-09-16 DIAGNOSIS — Z006 Encounter for examination for normal comparison and control in clinical research program: Secondary | ICD-10-CM | POA: Diagnosis not present

## 2013-09-16 DIAGNOSIS — Z23 Encounter for immunization: Secondary | ICD-10-CM | POA: Diagnosis not present

## 2013-09-16 DIAGNOSIS — E782 Mixed hyperlipidemia: Secondary | ICD-10-CM | POA: Diagnosis not present

## 2013-09-16 DIAGNOSIS — Z0000 Encounter for general adult medical examination without abnormal findings: Secondary | ICD-10-CM | POA: Diagnosis not present

## 2013-09-16 DIAGNOSIS — M1990 Unspecified osteoarthritis, unspecified site: Secondary | ICD-10-CM | POA: Diagnosis not present

## 2013-09-16 DIAGNOSIS — E559 Vitamin D deficiency, unspecified: Secondary | ICD-10-CM | POA: Diagnosis not present

## 2013-09-24 DIAGNOSIS — J209 Acute bronchitis, unspecified: Secondary | ICD-10-CM | POA: Diagnosis not present

## 2013-11-23 DIAGNOSIS — H35379 Puckering of macula, unspecified eye: Secondary | ICD-10-CM | POA: Diagnosis not present

## 2013-12-07 DIAGNOSIS — N390 Urinary tract infection, site not specified: Secondary | ICD-10-CM | POA: Diagnosis not present

## 2013-12-07 DIAGNOSIS — N952 Postmenopausal atrophic vaginitis: Secondary | ICD-10-CM | POA: Diagnosis not present

## 2013-12-07 DIAGNOSIS — R339 Retention of urine, unspecified: Secondary | ICD-10-CM | POA: Diagnosis not present

## 2014-02-11 DIAGNOSIS — H6120 Impacted cerumen, unspecified ear: Secondary | ICD-10-CM | POA: Diagnosis not present

## 2014-02-22 DIAGNOSIS — H35379 Puckering of macula, unspecified eye: Secondary | ICD-10-CM | POA: Diagnosis not present

## 2014-02-26 DIAGNOSIS — I428 Other cardiomyopathies: Secondary | ICD-10-CM | POA: Diagnosis not present

## 2014-02-26 DIAGNOSIS — I2510 Atherosclerotic heart disease of native coronary artery without angina pectoris: Secondary | ICD-10-CM | POA: Diagnosis not present

## 2014-02-26 DIAGNOSIS — E162 Hypoglycemia, unspecified: Secondary | ICD-10-CM | POA: Diagnosis not present

## 2014-02-26 DIAGNOSIS — I10 Essential (primary) hypertension: Secondary | ICD-10-CM | POA: Diagnosis not present

## 2014-02-26 DIAGNOSIS — E785 Hyperlipidemia, unspecified: Secondary | ICD-10-CM | POA: Diagnosis not present

## 2014-03-11 DIAGNOSIS — E162 Hypoglycemia, unspecified: Secondary | ICD-10-CM | POA: Diagnosis not present

## 2014-03-11 DIAGNOSIS — I428 Other cardiomyopathies: Secondary | ICD-10-CM | POA: Diagnosis not present

## 2014-03-11 DIAGNOSIS — I2510 Atherosclerotic heart disease of native coronary artery without angina pectoris: Secondary | ICD-10-CM | POA: Diagnosis not present

## 2014-03-11 DIAGNOSIS — I10 Essential (primary) hypertension: Secondary | ICD-10-CM | POA: Diagnosis not present

## 2014-03-30 DIAGNOSIS — K589 Irritable bowel syndrome without diarrhea: Secondary | ICD-10-CM | POA: Diagnosis not present

## 2014-03-30 DIAGNOSIS — K219 Gastro-esophageal reflux disease without esophagitis: Secondary | ICD-10-CM | POA: Diagnosis not present

## 2014-04-07 DIAGNOSIS — N3090 Cystitis, unspecified without hematuria: Secondary | ICD-10-CM | POA: Diagnosis not present

## 2014-04-07 DIAGNOSIS — N952 Postmenopausal atrophic vaginitis: Secondary | ICD-10-CM | POA: Diagnosis not present

## 2014-04-07 DIAGNOSIS — R339 Retention of urine, unspecified: Secondary | ICD-10-CM | POA: Diagnosis not present

## 2014-05-06 DIAGNOSIS — Z1231 Encounter for screening mammogram for malignant neoplasm of breast: Secondary | ICD-10-CM | POA: Diagnosis not present

## 2014-05-10 DIAGNOSIS — N905 Atrophy of vulva: Secondary | ICD-10-CM | POA: Diagnosis not present

## 2014-05-10 DIAGNOSIS — Z01419 Encounter for gynecological examination (general) (routine) without abnormal findings: Secondary | ICD-10-CM | POA: Diagnosis not present

## 2014-05-10 DIAGNOSIS — K5900 Constipation, unspecified: Secondary | ICD-10-CM | POA: Diagnosis not present

## 2014-05-31 DIAGNOSIS — H35379 Puckering of macula, unspecified eye: Secondary | ICD-10-CM | POA: Diagnosis not present

## 2014-06-23 DIAGNOSIS — G4700 Insomnia, unspecified: Secondary | ICD-10-CM | POA: Diagnosis not present

## 2014-06-23 DIAGNOSIS — K146 Glossodynia: Secondary | ICD-10-CM | POA: Diagnosis not present

## 2014-06-23 DIAGNOSIS — G40909 Epilepsy, unspecified, not intractable, without status epilepticus: Secondary | ICD-10-CM | POA: Diagnosis not present

## 2014-06-23 DIAGNOSIS — F411 Generalized anxiety disorder: Secondary | ICD-10-CM | POA: Diagnosis not present

## 2014-06-24 DIAGNOSIS — H35379 Puckering of macula, unspecified eye: Secondary | ICD-10-CM | POA: Diagnosis not present

## 2014-08-09 DIAGNOSIS — R339 Retention of urine, unspecified: Secondary | ICD-10-CM | POA: Diagnosis not present

## 2014-08-09 DIAGNOSIS — N3090 Cystitis, unspecified without hematuria: Secondary | ICD-10-CM | POA: Diagnosis not present

## 2014-08-09 DIAGNOSIS — N952 Postmenopausal atrophic vaginitis: Secondary | ICD-10-CM | POA: Diagnosis not present

## 2014-08-26 DIAGNOSIS — Z23 Encounter for immunization: Secondary | ICD-10-CM | POA: Diagnosis not present

## 2014-09-16 DIAGNOSIS — E785 Hyperlipidemia, unspecified: Secondary | ICD-10-CM | POA: Diagnosis not present

## 2014-09-16 DIAGNOSIS — I10 Essential (primary) hypertension: Secondary | ICD-10-CM | POA: Diagnosis not present

## 2014-09-16 DIAGNOSIS — I429 Cardiomyopathy, unspecified: Secondary | ICD-10-CM | POA: Diagnosis not present

## 2014-10-27 DIAGNOSIS — E785 Hyperlipidemia, unspecified: Secondary | ICD-10-CM | POA: Diagnosis not present

## 2014-10-28 DIAGNOSIS — H04123 Dry eye syndrome of bilateral lacrimal glands: Secondary | ICD-10-CM | POA: Diagnosis not present

## 2014-10-28 DIAGNOSIS — H35372 Puckering of macula, left eye: Secondary | ICD-10-CM | POA: Diagnosis not present

## 2014-12-10 DIAGNOSIS — N952 Postmenopausal atrophic vaginitis: Secondary | ICD-10-CM | POA: Diagnosis not present

## 2014-12-10 DIAGNOSIS — R339 Retention of urine, unspecified: Secondary | ICD-10-CM | POA: Diagnosis not present

## 2014-12-10 DIAGNOSIS — R109 Unspecified abdominal pain: Secondary | ICD-10-CM | POA: Diagnosis not present

## 2014-12-10 DIAGNOSIS — R300 Dysuria: Secondary | ICD-10-CM | POA: Diagnosis not present

## 2014-12-14 DIAGNOSIS — Z79899 Other long term (current) drug therapy: Secondary | ICD-10-CM | POA: Diagnosis not present

## 2014-12-14 DIAGNOSIS — I10 Essential (primary) hypertension: Secondary | ICD-10-CM | POA: Diagnosis not present

## 2014-12-14 DIAGNOSIS — Z0000 Encounter for general adult medical examination without abnormal findings: Secondary | ICD-10-CM | POA: Diagnosis not present

## 2014-12-14 DIAGNOSIS — E782 Mixed hyperlipidemia: Secondary | ICD-10-CM | POA: Diagnosis not present

## 2014-12-14 DIAGNOSIS — R7309 Other abnormal glucose: Secondary | ICD-10-CM | POA: Diagnosis not present

## 2014-12-14 DIAGNOSIS — M797 Fibromyalgia: Secondary | ICD-10-CM | POA: Diagnosis not present

## 2014-12-14 DIAGNOSIS — G40209 Localization-related (focal) (partial) symptomatic epilepsy and epileptic syndromes with complex partial seizures, not intractable, without status epilepticus: Secondary | ICD-10-CM | POA: Diagnosis not present

## 2014-12-14 DIAGNOSIS — Z23 Encounter for immunization: Secondary | ICD-10-CM | POA: Diagnosis not present

## 2014-12-14 DIAGNOSIS — E559 Vitamin D deficiency, unspecified: Secondary | ICD-10-CM | POA: Diagnosis not present

## 2014-12-14 DIAGNOSIS — M8580 Other specified disorders of bone density and structure, unspecified site: Secondary | ICD-10-CM | POA: Diagnosis not present

## 2015-03-03 DIAGNOSIS — H35372 Puckering of macula, left eye: Secondary | ICD-10-CM | POA: Diagnosis not present

## 2015-04-12 DIAGNOSIS — N952 Postmenopausal atrophic vaginitis: Secondary | ICD-10-CM | POA: Diagnosis not present

## 2015-04-12 DIAGNOSIS — R339 Retention of urine, unspecified: Secondary | ICD-10-CM | POA: Diagnosis not present

## 2015-04-12 DIAGNOSIS — N3080 Other cystitis without hematuria: Secondary | ICD-10-CM | POA: Diagnosis not present

## 2015-05-10 DIAGNOSIS — K648 Other hemorrhoids: Secondary | ICD-10-CM | POA: Diagnosis not present

## 2015-05-10 DIAGNOSIS — K219 Gastro-esophageal reflux disease without esophagitis: Secondary | ICD-10-CM | POA: Diagnosis not present

## 2015-05-26 DIAGNOSIS — Z1231 Encounter for screening mammogram for malignant neoplasm of breast: Secondary | ICD-10-CM | POA: Diagnosis not present

## 2015-06-23 DIAGNOSIS — G4700 Insomnia, unspecified: Secondary | ICD-10-CM | POA: Diagnosis not present

## 2015-06-23 DIAGNOSIS — F419 Anxiety disorder, unspecified: Secondary | ICD-10-CM | POA: Diagnosis not present

## 2015-06-23 DIAGNOSIS — K146 Glossodynia: Secondary | ICD-10-CM | POA: Diagnosis not present

## 2015-06-23 DIAGNOSIS — G40209 Localization-related (focal) (partial) symptomatic epilepsy and epileptic syndromes with complex partial seizures, not intractable, without status epilepticus: Secondary | ICD-10-CM | POA: Diagnosis not present

## 2015-07-04 DIAGNOSIS — H04123 Dry eye syndrome of bilateral lacrimal glands: Secondary | ICD-10-CM | POA: Diagnosis not present

## 2015-07-04 DIAGNOSIS — H3531 Nonexudative age-related macular degeneration: Secondary | ICD-10-CM | POA: Diagnosis not present

## 2015-07-04 DIAGNOSIS — H35341 Macular cyst, hole, or pseudohole, right eye: Secondary | ICD-10-CM | POA: Diagnosis not present

## 2015-07-04 DIAGNOSIS — H35373 Puckering of macula, bilateral: Secondary | ICD-10-CM | POA: Diagnosis not present

## 2015-09-09 DIAGNOSIS — Z23 Encounter for immunization: Secondary | ICD-10-CM | POA: Diagnosis not present

## 2015-10-12 DIAGNOSIS — R339 Retention of urine, unspecified: Secondary | ICD-10-CM | POA: Diagnosis not present

## 2015-10-12 DIAGNOSIS — N3090 Cystitis, unspecified without hematuria: Secondary | ICD-10-CM | POA: Diagnosis not present

## 2015-10-12 DIAGNOSIS — N952 Postmenopausal atrophic vaginitis: Secondary | ICD-10-CM | POA: Diagnosis not present

## 2015-10-17 DIAGNOSIS — I429 Cardiomyopathy, unspecified: Secondary | ICD-10-CM | POA: Diagnosis not present

## 2015-10-17 DIAGNOSIS — I10 Essential (primary) hypertension: Secondary | ICD-10-CM | POA: Diagnosis not present

## 2015-10-17 DIAGNOSIS — E785 Hyperlipidemia, unspecified: Secondary | ICD-10-CM | POA: Diagnosis not present

## 2015-11-02 DIAGNOSIS — E785 Hyperlipidemia, unspecified: Secondary | ICD-10-CM | POA: Diagnosis not present

## 2015-11-02 DIAGNOSIS — I10 Essential (primary) hypertension: Secondary | ICD-10-CM | POA: Diagnosis not present

## 2015-11-08 DIAGNOSIS — H353132 Nonexudative age-related macular degeneration, bilateral, intermediate dry stage: Secondary | ICD-10-CM | POA: Diagnosis not present

## 2016-01-02 DIAGNOSIS — Z79899 Other long term (current) drug therapy: Secondary | ICD-10-CM | POA: Diagnosis not present

## 2016-01-02 DIAGNOSIS — K210 Gastro-esophageal reflux disease with esophagitis: Secondary | ICD-10-CM | POA: Diagnosis not present

## 2016-01-02 DIAGNOSIS — Z0000 Encounter for general adult medical examination without abnormal findings: Secondary | ICD-10-CM | POA: Diagnosis not present

## 2016-01-02 DIAGNOSIS — E559 Vitamin D deficiency, unspecified: Secondary | ICD-10-CM | POA: Diagnosis not present

## 2016-01-02 DIAGNOSIS — E782 Mixed hyperlipidemia: Secondary | ICD-10-CM | POA: Diagnosis not present

## 2016-01-02 DIAGNOSIS — G40209 Localization-related (focal) (partial) symptomatic epilepsy and epileptic syndromes with complex partial seizures, not intractable, without status epilepticus: Secondary | ICD-10-CM | POA: Diagnosis not present

## 2016-01-02 DIAGNOSIS — F329 Major depressive disorder, single episode, unspecified: Secondary | ICD-10-CM | POA: Diagnosis not present

## 2016-02-07 DIAGNOSIS — R1314 Dysphagia, pharyngoesophageal phase: Secondary | ICD-10-CM | POA: Diagnosis not present

## 2016-02-10 DIAGNOSIS — R1310 Dysphagia, unspecified: Secondary | ICD-10-CM | POA: Diagnosis not present

## 2016-02-10 DIAGNOSIS — K449 Diaphragmatic hernia without obstruction or gangrene: Secondary | ICD-10-CM | POA: Diagnosis not present

## 2016-02-10 DIAGNOSIS — K224 Dyskinesia of esophagus: Secondary | ICD-10-CM | POA: Diagnosis not present

## 2016-02-27 ENCOUNTER — Other Ambulatory Visit: Payer: Self-pay

## 2016-02-27 DIAGNOSIS — I252 Old myocardial infarction: Secondary | ICD-10-CM | POA: Diagnosis not present

## 2016-02-27 DIAGNOSIS — K219 Gastro-esophageal reflux disease without esophagitis: Secondary | ICD-10-CM | POA: Diagnosis not present

## 2016-02-27 DIAGNOSIS — Z9049 Acquired absence of other specified parts of digestive tract: Secondary | ICD-10-CM | POA: Diagnosis not present

## 2016-02-27 DIAGNOSIS — E782 Mixed hyperlipidemia: Secondary | ICD-10-CM | POA: Diagnosis not present

## 2016-02-27 DIAGNOSIS — K2970 Gastritis, unspecified, without bleeding: Secondary | ICD-10-CM | POA: Diagnosis not present

## 2016-02-27 DIAGNOSIS — F419 Anxiety disorder, unspecified: Secondary | ICD-10-CM | POA: Diagnosis not present

## 2016-02-27 DIAGNOSIS — K3189 Other diseases of stomach and duodenum: Secondary | ICD-10-CM | POA: Diagnosis not present

## 2016-02-27 DIAGNOSIS — K589 Irritable bowel syndrome without diarrhea: Secondary | ICD-10-CM | POA: Diagnosis not present

## 2016-02-27 DIAGNOSIS — J449 Chronic obstructive pulmonary disease, unspecified: Secondary | ICD-10-CM | POA: Diagnosis not present

## 2016-02-27 DIAGNOSIS — F329 Major depressive disorder, single episode, unspecified: Secondary | ICD-10-CM | POA: Diagnosis not present

## 2016-02-27 DIAGNOSIS — K2900 Acute gastritis without bleeding: Secondary | ICD-10-CM | POA: Diagnosis not present

## 2016-02-27 DIAGNOSIS — G4700 Insomnia, unspecified: Secondary | ICD-10-CM | POA: Diagnosis not present

## 2016-02-27 DIAGNOSIS — G40209 Localization-related (focal) (partial) symptomatic epilepsy and epileptic syndromes with complex partial seizures, not intractable, without status epilepticus: Secondary | ICD-10-CM | POA: Diagnosis not present

## 2016-02-27 DIAGNOSIS — M1990 Unspecified osteoarthritis, unspecified site: Secondary | ICD-10-CM | POA: Diagnosis not present

## 2016-02-27 DIAGNOSIS — K319 Disease of stomach and duodenum, unspecified: Secondary | ICD-10-CM | POA: Diagnosis not present

## 2016-02-27 DIAGNOSIS — R1314 Dysphagia, pharyngoesophageal phase: Secondary | ICD-10-CM | POA: Diagnosis not present

## 2016-02-27 DIAGNOSIS — Z79899 Other long term (current) drug therapy: Secondary | ICD-10-CM | POA: Diagnosis not present

## 2016-02-27 DIAGNOSIS — I10 Essential (primary) hypertension: Secondary | ICD-10-CM | POA: Diagnosis not present

## 2016-02-29 DIAGNOSIS — H353132 Nonexudative age-related macular degeneration, bilateral, intermediate dry stage: Secondary | ICD-10-CM | POA: Diagnosis not present

## 2016-04-11 DIAGNOSIS — N390 Urinary tract infection, site not specified: Secondary | ICD-10-CM | POA: Diagnosis not present

## 2016-04-11 DIAGNOSIS — N3090 Cystitis, unspecified without hematuria: Secondary | ICD-10-CM | POA: Diagnosis not present

## 2016-04-11 DIAGNOSIS — N952 Postmenopausal atrophic vaginitis: Secondary | ICD-10-CM | POA: Diagnosis not present

## 2016-04-11 DIAGNOSIS — R339 Retention of urine, unspecified: Secondary | ICD-10-CM | POA: Diagnosis not present

## 2016-05-15 DIAGNOSIS — R05 Cough: Secondary | ICD-10-CM | POA: Diagnosis not present

## 2016-05-15 DIAGNOSIS — M47814 Spondylosis without myelopathy or radiculopathy, thoracic region: Secondary | ICD-10-CM | POA: Diagnosis not present

## 2016-05-15 DIAGNOSIS — R0602 Shortness of breath: Secondary | ICD-10-CM | POA: Diagnosis not present

## 2016-05-15 DIAGNOSIS — K449 Diaphragmatic hernia without obstruction or gangrene: Secondary | ICD-10-CM | POA: Diagnosis not present

## 2016-05-15 DIAGNOSIS — J069 Acute upper respiratory infection, unspecified: Secondary | ICD-10-CM | POA: Diagnosis not present

## 2016-05-15 DIAGNOSIS — J209 Acute bronchitis, unspecified: Secondary | ICD-10-CM | POA: Diagnosis not present

## 2016-05-21 DIAGNOSIS — I10 Essential (primary) hypertension: Secondary | ICD-10-CM | POA: Diagnosis not present

## 2016-05-21 DIAGNOSIS — I420 Dilated cardiomyopathy: Secondary | ICD-10-CM | POA: Diagnosis not present

## 2016-05-21 DIAGNOSIS — E785 Hyperlipidemia, unspecified: Secondary | ICD-10-CM | POA: Diagnosis not present

## 2016-05-22 DIAGNOSIS — J40 Bronchitis, not specified as acute or chronic: Secondary | ICD-10-CM | POA: Diagnosis not present

## 2016-05-23 DIAGNOSIS — E785 Hyperlipidemia, unspecified: Secondary | ICD-10-CM | POA: Diagnosis not present

## 2016-05-23 DIAGNOSIS — I420 Dilated cardiomyopathy: Secondary | ICD-10-CM | POA: Diagnosis not present

## 2016-05-23 DIAGNOSIS — I10 Essential (primary) hypertension: Secondary | ICD-10-CM | POA: Diagnosis not present

## 2016-06-20 DIAGNOSIS — Z9181 History of falling: Secondary | ICD-10-CM | POA: Diagnosis not present

## 2016-06-20 DIAGNOSIS — F419 Anxiety disorder, unspecified: Secondary | ICD-10-CM | POA: Diagnosis not present

## 2016-06-20 DIAGNOSIS — G40209 Localization-related (focal) (partial) symptomatic epilepsy and epileptic syndromes with complex partial seizures, not intractable, without status epilepticus: Secondary | ICD-10-CM | POA: Insufficient documentation

## 2016-06-20 DIAGNOSIS — G4700 Insomnia, unspecified: Secondary | ICD-10-CM | POA: Insufficient documentation

## 2016-06-20 DIAGNOSIS — K146 Glossodynia: Secondary | ICD-10-CM | POA: Insufficient documentation

## 2016-07-24 DIAGNOSIS — Z01411 Encounter for gynecological examination (general) (routine) with abnormal findings: Secondary | ICD-10-CM | POA: Diagnosis not present

## 2016-07-24 DIAGNOSIS — R350 Frequency of micturition: Secondary | ICD-10-CM | POA: Diagnosis not present

## 2016-07-24 DIAGNOSIS — N905 Atrophy of vulva: Secondary | ICD-10-CM | POA: Diagnosis not present

## 2016-07-24 DIAGNOSIS — N952 Postmenopausal atrophic vaginitis: Secondary | ICD-10-CM | POA: Diagnosis not present

## 2016-07-24 DIAGNOSIS — N3941 Urge incontinence: Secondary | ICD-10-CM | POA: Diagnosis not present

## 2016-08-29 DIAGNOSIS — H35372 Puckering of macula, left eye: Secondary | ICD-10-CM | POA: Diagnosis not present

## 2016-08-29 DIAGNOSIS — Z23 Encounter for immunization: Secondary | ICD-10-CM | POA: Diagnosis not present

## 2016-08-29 DIAGNOSIS — H353132 Nonexudative age-related macular degeneration, bilateral, intermediate dry stage: Secondary | ICD-10-CM | POA: Diagnosis not present

## 2016-08-29 DIAGNOSIS — H26493 Other secondary cataract, bilateral: Secondary | ICD-10-CM | POA: Diagnosis not present

## 2016-10-12 DIAGNOSIS — N3000 Acute cystitis without hematuria: Secondary | ICD-10-CM | POA: Diagnosis not present

## 2016-10-12 DIAGNOSIS — R05 Cough: Secondary | ICD-10-CM | POA: Diagnosis not present

## 2016-10-12 DIAGNOSIS — R21 Rash and other nonspecific skin eruption: Secondary | ICD-10-CM | POA: Diagnosis not present

## 2016-10-12 DIAGNOSIS — N3001 Acute cystitis with hematuria: Secondary | ICD-10-CM | POA: Diagnosis not present

## 2016-10-12 DIAGNOSIS — J209 Acute bronchitis, unspecified: Secondary | ICD-10-CM | POA: Diagnosis not present

## 2016-10-12 DIAGNOSIS — R300 Dysuria: Secondary | ICD-10-CM | POA: Diagnosis not present

## 2016-10-12 DIAGNOSIS — R5381 Other malaise: Secondary | ICD-10-CM | POA: Diagnosis not present

## 2016-12-03 DIAGNOSIS — Z1231 Encounter for screening mammogram for malignant neoplasm of breast: Secondary | ICD-10-CM | POA: Diagnosis not present

## 2016-12-14 DIAGNOSIS — R928 Other abnormal and inconclusive findings on diagnostic imaging of breast: Secondary | ICD-10-CM | POA: Diagnosis not present

## 2016-12-14 DIAGNOSIS — N6311 Unspecified lump in the right breast, upper outer quadrant: Secondary | ICD-10-CM | POA: Diagnosis not present

## 2016-12-14 DIAGNOSIS — N6310 Unspecified lump in the right breast, unspecified quadrant: Secondary | ICD-10-CM | POA: Diagnosis not present

## 2016-12-14 DIAGNOSIS — R921 Mammographic calcification found on diagnostic imaging of breast: Secondary | ICD-10-CM | POA: Diagnosis not present

## 2016-12-24 DIAGNOSIS — D0511 Intraductal carcinoma in situ of right breast: Secondary | ICD-10-CM | POA: Diagnosis not present

## 2016-12-24 DIAGNOSIS — D0591 Unspecified type of carcinoma in situ of right breast: Secondary | ICD-10-CM | POA: Diagnosis not present

## 2016-12-24 DIAGNOSIS — R921 Mammographic calcification found on diagnostic imaging of breast: Secondary | ICD-10-CM | POA: Diagnosis not present

## 2017-01-04 ENCOUNTER — Encounter: Payer: Self-pay | Admitting: General Surgery

## 2017-01-04 DIAGNOSIS — C50511 Malignant neoplasm of lower-outer quadrant of right female breast: Secondary | ICD-10-CM | POA: Diagnosis not present

## 2017-01-07 DIAGNOSIS — I420 Dilated cardiomyopathy: Secondary | ICD-10-CM | POA: Diagnosis not present

## 2017-01-07 DIAGNOSIS — I10 Essential (primary) hypertension: Secondary | ICD-10-CM | POA: Diagnosis not present

## 2017-01-07 DIAGNOSIS — E785 Hyperlipidemia, unspecified: Secondary | ICD-10-CM | POA: Diagnosis not present

## 2017-01-08 DIAGNOSIS — E785 Hyperlipidemia, unspecified: Secondary | ICD-10-CM | POA: Diagnosis not present

## 2017-01-08 DIAGNOSIS — I10 Essential (primary) hypertension: Secondary | ICD-10-CM | POA: Diagnosis not present

## 2017-01-08 DIAGNOSIS — I420 Dilated cardiomyopathy: Secondary | ICD-10-CM | POA: Diagnosis not present

## 2017-01-11 ENCOUNTER — Other Ambulatory Visit: Payer: Self-pay | Admitting: General Surgery

## 2017-01-11 DIAGNOSIS — C50411 Malignant neoplasm of upper-outer quadrant of right female breast: Secondary | ICD-10-CM

## 2017-01-11 DIAGNOSIS — Z170 Estrogen receptor positive status [ER+]: Principal | ICD-10-CM

## 2017-01-12 DIAGNOSIS — R300 Dysuria: Secondary | ICD-10-CM | POA: Diagnosis not present

## 2017-01-12 DIAGNOSIS — H6121 Impacted cerumen, right ear: Secondary | ICD-10-CM | POA: Diagnosis not present

## 2017-01-15 ENCOUNTER — Other Ambulatory Visit: Payer: Self-pay | Admitting: General Surgery

## 2017-01-15 DIAGNOSIS — C50411 Malignant neoplasm of upper-outer quadrant of right female breast: Secondary | ICD-10-CM

## 2017-01-15 DIAGNOSIS — Z170 Estrogen receptor positive status [ER+]: Principal | ICD-10-CM

## 2017-01-17 ENCOUNTER — Encounter (HOSPITAL_COMMUNITY)
Admission: RE | Admit: 2017-01-17 | Discharge: 2017-01-17 | Disposition: A | Discharge status: Home / Self Care | Payer: Medicare Other | Source: Ambulatory Visit | Attending: General Surgery | Admitting: General Surgery

## 2017-01-17 ENCOUNTER — Encounter (HOSPITAL_COMMUNITY): Payer: Self-pay

## 2017-01-17 DIAGNOSIS — H9209 Otalgia, unspecified ear: Secondary | ICD-10-CM | POA: Diagnosis not present

## 2017-01-17 DIAGNOSIS — H6123 Impacted cerumen, bilateral: Secondary | ICD-10-CM | POA: Diagnosis not present

## 2017-01-17 DIAGNOSIS — Z01818 Encounter for other preprocedural examination: Secondary | ICD-10-CM | POA: Diagnosis not present

## 2017-01-17 DIAGNOSIS — C50911 Malignant neoplasm of unspecified site of right female breast: Secondary | ICD-10-CM | POA: Diagnosis not present

## 2017-01-17 HISTORY — DX: Anemia, unspecified: D64.9

## 2017-01-17 HISTORY — DX: Cough: R05

## 2017-01-17 HISTORY — DX: Personal history of other diseases of the respiratory system: Z87.09

## 2017-01-17 HISTORY — DX: Cough, unspecified: R05.9

## 2017-01-17 HISTORY — DX: Acute myocardial infarction, unspecified: I21.9

## 2017-01-17 HISTORY — DX: Cardiac murmur, unspecified: R01.1

## 2017-01-17 HISTORY — DX: Personal history of urinary (tract) infections: Z87.440

## 2017-01-17 HISTORY — DX: Unspecified convulsions: R56.9

## 2017-01-17 LAB — CBC
HEMATOCRIT: 40.6 % (ref 36.0–46.0)
HEMOGLOBIN: 13.9 g/dL (ref 12.0–15.0)
MCH: 30.5 pg (ref 26.0–34.0)
MCHC: 34.2 g/dL (ref 30.0–36.0)
MCV: 89 fL (ref 78.0–100.0)
Platelets: 182 10*3/uL (ref 150–400)
RBC: 4.56 MIL/uL (ref 3.87–5.11)
RDW: 13 % (ref 11.5–15.5)
WBC: 7.2 10*3/uL (ref 4.0–10.5)

## 2017-01-17 LAB — BASIC METABOLIC PANEL
Anion gap: 7 (ref 5–15)
BUN: 10 mg/dL (ref 6–20)
CHLORIDE: 108 mmol/L (ref 101–111)
CO2: 27 mmol/L (ref 22–32)
CREATININE: 0.85 mg/dL (ref 0.44–1.00)
Calcium: 9.5 mg/dL (ref 8.9–10.3)
GFR calc Af Amer: >60 mL/min (ref >60)
GFR calc non Af Amer: 60 mL/min — ABNORMAL LOW (ref >60)
Glucose, Bld: 125 mg/dL — ABNORMAL HIGH (ref 65–99)
Potassium: 3.7 mmol/L (ref 3.5–5.1)
Sodium: 142 mmol/L (ref 135–145)

## 2017-01-17 MED ORDER — CHLORHEXIDINE GLUCONATE CLOTH 2 % EX PADS: 6.0000 | MEDICATED_PAD | Freq: Once | CUTANEOUS | Status: DC

## 2017-01-17 NOTE — Progress Notes (Signed)
PCP: Janace Litten Cardiologist: Dr. Agustin Cree   Fax sent requesting ECHO, EKG and stress test  ECHO 05/29/16 Stress test 12/2016 EKG: 7.5.17  Pt denies cardiac symptoms. No chest pain, SOB or signs of infection.

## 2017-01-17 NOTE — Pre-Procedure Instructions (Addendum)
NAOMA BOXELL  01/17/2017      [facility] [address] [place] [address] [place] [address] Phone: [phone] Fax: [phone]    Your procedure is scheduled on March 14  Report to [place] at 630 A.M.  Call this number if you have problems the morning of surgery:  [phone]   Remember:  Do not eat food or drink liquids after midnight.  Take these medicines the morning of surgery with A SIP OF WATER tylenol if needed, Bethanechol (Urecholine), carvedilol (coreg), Clonazepam (Klonopin), esomeprazole (Nexium), Levetiracetam (Keppra), Pregabalin (Lyrica), Silodosin (Rapaflo)  Stop taking today: Aspirin, fish oil, vitamins, glucosamine, antiinflammatories  Drink Boost 8oz drink by 4:30 AM the day of surgery.    Do not wear jewelry, make-up or nail polish.  Do not wear lotions, powders, or perfumes, or deoderant.  Do not shave 48 hours prior to surgery.  Men may shave face and neck.  Do not bring valuables to the hospital.  [facility] is not responsible for any belongings or valuables.  Contacts, dentures or bridgework may not be worn into surgery.  Leave your suitcase in the car.  After surgery it may be brought to your room.  For patients admitted to the hospital, discharge time will be determined by your treatment team.  Patients discharged the day of surgery will not be allowed to drive home.    Special instructions:  [place] - Preparing for Surgery  Before surgery, you can play an important role.  Because skin is not sterile, your skin needs to be as free of germs as possible.  You can reduce the number of germs on you skin by washing with CHG (chlorahexidine gluconate) soap before surgery.  CHG is an antiseptic cleaner which kills germs and bonds with the skin to continue killing germs even after washing.  Please DO NOT use if you have an allergy to CHG or antibacterial soaps.  If your skin  becomes reddened/irritated stop using the CHG and inform your nurse when you arrive at Short Stay.  Do not shave (including legs and underarms) for at least 48 hours prior to the first CHG shower.  You may shave your face.  Please follow these instructions carefully:   1.  Shower with CHG Soap the night before surgery and the                                morning of Surgery.  2.  If you choose to wash your hair, wash your hair first as usual with your       normal shampoo.  3.  After you shampoo, rinse your hair and body thoroughly to remove the                      Shampoo.  4.  Use CHG as you would any other liquid soap.  You can apply chg directly       to the skin and wash gently with scrungie or a clean washcloth.  5.  Apply the CHG Soap to your body ONLY FROM THE NECK DOWN.        Do not use on open wounds or open sores.  Avoid contact with your eyes,       ears, mouth and genitals (private parts).  Wash genitals (private parts)  with your normal soap.  6.  Wash thoroughly, paying special attention to the area where your surgery        will be performed.  7.  Thoroughly rinse your body with warm water from the neck down.  8.  DO NOT shower/wash with your normal soap after using and rinsing off       the CHG Soap.  9.  Pat yourself dry with a clean towel.            10.  Wear clean pajamas.            11.  Place clean sheets on your bed the night of your first shower and do not        sleep with pets.  Day of Surgery  Do not apply any lotions/deoderants the morning of surgery.  Please wear clean clothes to the hospital/surgery center.     Please read over the following fact sheets that you were given. Pain Booklet, Coughing and Deep Breathing and Surgical Site Infection Prevention

## 2017-01-18 NOTE — Progress Notes (Signed)
Anesthesia Chart Review:  Pt is an 83 year old female scheduled for right breast lumpectomy with radioactive seed localization on 01/23/2017 with Rolm Bookbinder, M.D.  - PCP is Janace Litten, MD - Cardiologist is Jenne Campus, MD last office visit 01/07/17 for pre-op eval. Stress test ordered, results below.   PMH includes: Takotsubo cardiomyopathy (recovered, EF 60-65% 05/2016), MI, HTN, heart murmur, seizures, GERD. Never smoker. BMI 27.  Medications include: ASA 81 mg, carvedilol, Nexium, Keppra, pravastatin, telmisartan  Preoperative labs reviewed.    CXR 05/15/16 (care everywhere): No edema or consolidation. Stable cardiac silhouette. Aortic atherosclerosis.  EKG 05/15/16 (care everywhere): NSR. Low voltage QRS. Septal infarct. - Tracing requested.   Nuclear stress test 01/08/17 [facility] cardiology [place]): 1. Functional capacity is not assessed.  2 small LV cavity, normal contraction throughout. EF 62%. 3. Normal myocardial perfusion scans: Rest and vasodilator stress. 4. Negative Lexiscan myocardial perfusion stress test.  Echo 05/23/16 [facility], cardiology [place]): 1. LV cavity normal in size. Doppler evidence grade I diastolic dysfunction. Cannulated EF 60-65%. 2. Trileaflet aortic valve with trace regurgitation. Mild aortic valve leaflet thickening with mild calcification. 3. Trace mitral regurgitation. Mild mitral valve leaflet thickening with mild calcification. 4. Trace TR. 5. Mild PRI.  If no changes, I anticipate pt can proceed as scheduled.   Willeen Cass, FNP-BC [facility] Short Stay Surgical Center/Anesthesiology Phone: [phone] 01/18/2017 10:59 AM

## 2017-01-22 ENCOUNTER — Ambulatory Visit
Admission: RE | Admit: 2017-01-22 | Discharge: 2017-01-22 | Disposition: A | Discharge status: Home / Self Care | Payer: Medicare Other | Source: Ambulatory Visit | Attending: General Surgery | Admitting: General Surgery

## 2017-01-22 DIAGNOSIS — C50411 Malignant neoplasm of upper-outer quadrant of right female breast: Secondary | ICD-10-CM

## 2017-01-22 DIAGNOSIS — C50911 Malignant neoplasm of unspecified site of right female breast: Secondary | ICD-10-CM | POA: Diagnosis not present

## 2017-01-22 DIAGNOSIS — Z170 Estrogen receptor positive status [ER+]: Principal | ICD-10-CM

## 2017-01-22 NOTE — Anesthesia Preprocedure Evaluation (Addendum)
Anesthesia Evaluation  Patient identified by MRN, date of birth, ID band Patient awake    Reviewed: Allergy & Precautions, NPO status , Patient's Chart, lab work & pertinent test results, reviewed documented beta blocker date and time   Airway Mallampati: II  TM Distance: >3 FB Neck ROM: Full    Dental  (+) Edentulous Upper, Edentulous Lower   Pulmonary    breath sounds clear to auscultation       Cardiovascular hypertension, Pt. on medications and Pt. on home beta blockers + Past MI   Rhythm:Regular Rate:Normal  12/2016 Nuc stress test: Nl EF. No ischemia.  Echo 05/23/16 [facility], cardiology [place]): 1. LV cavity normal in size. Doppler evidence grade I diastolic dysfunction. Cannulated EF 60-65%. 2. Trileaflet aortic valve with trace regurgitation. Mild aortic valve leaflet thickening with mild calcification. 3. Trace mitral regurgitation. Mild mitral valve leaflet thickening with mild calcification. 4. Trace TR. 5. Mild PRI.   Neuro/Psych Seizures -, Well Controlled,  Anxiety  Neuromuscular disease    GI/Hepatic Neg liver ROS, GERD  ,  Endo/Other  negative endocrine ROS  Renal/GU negative Renal ROS     Musculoskeletal   Abdominal   Peds  Hematology negative hematology ROS (+)   Anesthesia Other Findings   Reproductive/Obstetrics                           Lab Results  Component Value Date   WBC 7.2 01/17/2017   HGB 13.9 01/17/2017   HCT 40.6 01/17/2017   MCV 89.0 01/17/2017   PLT 182 01/17/2017   Lab Results  Component Value Date   CREATININE 0.85 01/17/2017   BUN 10 01/17/2017   NA 142 01/17/2017   K 3.7 01/17/2017   CL 108 01/17/2017   CO2 27 01/17/2017    Anesthesia Physical Anesthesia Plan  ASA: III  Anesthesia Plan: General   Post-op Pain Management:    Induction: Intravenous  Airway Management Planned: LMA  Additional Equipment:   Intra-op Plan:    Post-operative Plan: Extubation in OR  Informed Consent: I have reviewed the patients History and Physical, chart, labs and discussed the procedure including the risks, benefits and alternatives for the proposed anesthesia with the patient or authorized representative who has indicated his/her understanding and acceptance.     Plan Discussed with: CRNA  Anesthesia Plan Comments:        Anesthesia Quick Evaluation

## 2017-01-23 ENCOUNTER — Ambulatory Visit (HOSPITAL_COMMUNITY): Payer: Medicare Other | Admitting: Emergency Medicine

## 2017-01-23 ENCOUNTER — Encounter (HOSPITAL_COMMUNITY): Payer: Self-pay | Admitting: *Deleted

## 2017-01-23 ENCOUNTER — Ambulatory Visit
Admission: RE | Admit: 2017-01-23 | Discharge: 2017-01-23 | Disposition: A | Discharge status: Home / Self Care | Payer: Medicare Other | Source: Ambulatory Visit | Attending: General Surgery | Admitting: General Surgery

## 2017-01-23 ENCOUNTER — Ambulatory Visit (HOSPITAL_COMMUNITY)
Admission: RE | Admit: 2017-01-23 | Discharge: 2017-01-23 | Disposition: A | Discharge status: Home / Self Care | Payer: Medicare Other | Source: Ambulatory Visit | Attending: General Surgery | Admitting: General Surgery

## 2017-01-23 ENCOUNTER — Ambulatory Visit (HOSPITAL_COMMUNITY): Payer: Medicare Other | Admitting: Anesthesiology

## 2017-01-23 ENCOUNTER — Encounter (HOSPITAL_COMMUNITY)
Admission: RE | Disposition: A | Discharge status: Home / Self Care | Payer: Self-pay | Source: Ambulatory Visit | Attending: General Surgery

## 2017-01-23 DIAGNOSIS — I252 Old myocardial infarction: Secondary | ICD-10-CM | POA: Diagnosis not present

## 2017-01-23 DIAGNOSIS — G40909 Epilepsy, unspecified, not intractable, without status epilepticus: Secondary | ICD-10-CM | POA: Insufficient documentation

## 2017-01-23 DIAGNOSIS — K219 Gastro-esophageal reflux disease without esophagitis: Secondary | ICD-10-CM | POA: Insufficient documentation

## 2017-01-23 DIAGNOSIS — F419 Anxiety disorder, unspecified: Secondary | ICD-10-CM | POA: Diagnosis not present

## 2017-01-23 DIAGNOSIS — Z170 Estrogen receptor positive status [ER+]: Principal | ICD-10-CM

## 2017-01-23 DIAGNOSIS — Z79890 Hormone replacement therapy (postmenopausal): Secondary | ICD-10-CM | POA: Diagnosis not present

## 2017-01-23 DIAGNOSIS — Z885 Allergy status to narcotic agent status: Secondary | ICD-10-CM | POA: Insufficient documentation

## 2017-01-23 DIAGNOSIS — Z882 Allergy status to sulfonamides status: Secondary | ICD-10-CM | POA: Diagnosis not present

## 2017-01-23 DIAGNOSIS — D0511 Intraductal carcinoma in situ of right breast: Secondary | ICD-10-CM | POA: Insufficient documentation

## 2017-01-23 DIAGNOSIS — Z880 Allergy status to penicillin: Secondary | ICD-10-CM | POA: Diagnosis not present

## 2017-01-23 DIAGNOSIS — C50911 Malignant neoplasm of unspecified site of right female breast: Secondary | ICD-10-CM | POA: Diagnosis not present

## 2017-01-23 DIAGNOSIS — Z79899 Other long term (current) drug therapy: Secondary | ICD-10-CM | POA: Diagnosis not present

## 2017-01-23 DIAGNOSIS — Z800 Family history of malignant neoplasm of digestive organs: Secondary | ICD-10-CM | POA: Diagnosis not present

## 2017-01-23 DIAGNOSIS — C50411 Malignant neoplasm of upper-outer quadrant of right female breast: Secondary | ICD-10-CM

## 2017-01-23 DIAGNOSIS — E7800 Pure hypercholesterolemia, unspecified: Secondary | ICD-10-CM | POA: Diagnosis not present

## 2017-01-23 DIAGNOSIS — I10 Essential (primary) hypertension: Secondary | ICD-10-CM | POA: Insufficient documentation

## 2017-01-23 DIAGNOSIS — Z171 Estrogen receptor negative status [ER-]: Secondary | ICD-10-CM | POA: Diagnosis not present

## 2017-01-23 DIAGNOSIS — Z792 Long term (current) use of antibiotics: Secondary | ICD-10-CM | POA: Insufficient documentation

## 2017-01-23 DIAGNOSIS — N6021 Fibroadenosis of right breast: Secondary | ICD-10-CM | POA: Diagnosis not present

## 2017-01-23 DIAGNOSIS — Z7982 Long term (current) use of aspirin: Secondary | ICD-10-CM | POA: Insufficient documentation

## 2017-01-23 DIAGNOSIS — R921 Mammographic calcification found on diagnostic imaging of breast: Secondary | ICD-10-CM | POA: Diagnosis not present

## 2017-01-23 DIAGNOSIS — N6011 Diffuse cystic mastopathy of right breast: Secondary | ICD-10-CM | POA: Diagnosis not present

## 2017-01-23 HISTORY — PX: BREAST LUMPECTOMY WITH RADIOACTIVE SEED LOCALIZATION: SHX6424

## 2017-01-23 SURGERY — BREAST LUMPECTOMY WITH RADIOACTIVE SEED LOCALIZATION
Anesthesia: General | Site: Breast | Laterality: Right

## 2017-01-23 MED ORDER — FENTANYL CITRATE (PF) 100 MCG/2ML IJ SOLN
INTRAMUSCULAR | Status: DC | PRN
  Administered 2017-01-23: 25 ug via INTRAVENOUS

## 2017-01-23 MED ORDER — LIDOCAINE HCL (CARDIAC) 20 MG/ML IV SOLN
INTRAVENOUS | Status: DC | PRN
  Administered 2017-01-23: 60 mg via INTRAVENOUS

## 2017-01-23 MED ORDER — BUPIVACAINE HCL 0.25 % IJ SOLN
INTRAMUSCULAR | Status: DC | PRN
  Administered 2017-01-23: 10 mL

## 2017-01-23 MED ORDER — BUPIVACAINE HCL (PF) 0.25 % IJ SOLN
INTRAMUSCULAR | Status: AC
  Filled 2017-01-23: qty 30

## 2017-01-23 MED ORDER — LACTATED RINGERS IV SOLN
INTRAVENOUS | Status: DC | PRN
  Administered 2017-01-23: 08:00:00 via INTRAVENOUS

## 2017-01-23 MED ORDER — HEMOSTATIC AGENTS (NO CHARGE) OPTIME
TOPICAL | Status: DC | PRN
  Administered 2017-01-23: 1 via TOPICAL

## 2017-01-23 MED ORDER — GLYCOPYRROLATE 0.2 MG/ML IJ SOLN
INTRAMUSCULAR | Status: DC | PRN
  Administered 2017-01-23 (×2): 0.2 mg via INTRAVENOUS

## 2017-01-23 MED ORDER — FENTANYL CITRATE (PF) 100 MCG/2ML IJ SOLN
INTRAMUSCULAR | Status: AC
  Filled 2017-01-23: qty 2

## 2017-01-23 MED ORDER — PROPOFOL 10 MG/ML IV BOLUS
INTRAVENOUS | Status: AC
  Filled 2017-01-23: qty 20

## 2017-01-23 MED ORDER — HYDROMORPHONE HCL 1 MG/ML IJ SOLN: 0.2500 mg | INTRAMUSCULAR | Status: DC | PRN

## 2017-01-23 MED ORDER — GABAPENTIN 300 MG PO CAPS
300.0000 mg | ORAL_CAPSULE | ORAL | Status: AC
  Administered 2017-01-23: 300 mg via ORAL
  Filled 2017-01-23: qty 1

## 2017-01-23 MED ORDER — PROPOFOL 10 MG/ML IV BOLUS
INTRAVENOUS | Status: DC | PRN
  Administered 2017-01-23: 120 mg via INTRAVENOUS

## 2017-01-23 MED ORDER — ONDANSETRON HCL 4 MG/2ML IJ SOLN
INTRAMUSCULAR | Status: DC | PRN
  Administered 2017-01-23: 4 mg via INTRAVENOUS

## 2017-01-23 MED ORDER — ACETAMINOPHEN 500 MG PO TABS
1000.0000 mg | ORAL_TABLET | ORAL | Status: AC
  Administered 2017-01-23: 1000 mg via ORAL
  Filled 2017-01-23: qty 2

## 2017-01-23 MED ORDER — 0.9 % SODIUM CHLORIDE (POUR BTL) OPTIME
TOPICAL | Status: DC | PRN
  Administered 2017-01-23: 1000 mL

## 2017-01-23 MED ORDER — EPHEDRINE SULFATE 50 MG/ML IJ SOLN
INTRAMUSCULAR | Status: DC | PRN
  Administered 2017-01-23: 10 mg via INTRAVENOUS
  Administered 2017-01-23: 5 mg via INTRAVENOUS

## 2017-01-23 SURGICAL SUPPLY — 56 items
ADH SKN CLS APL DERMABOND .7 (GAUZE/BANDAGES/DRESSINGS) ×1
APPLIER CLIP 9.375 MED OPEN (MISCELLANEOUS) ×3
APR CLP MED 9.3 20 MLT OPN (MISCELLANEOUS) ×1
BINDER BREAST LRG (GAUZE/BANDAGES/DRESSINGS) IMPLANT
BINDER BREAST MEDIUM (GAUZE/BANDAGES/DRESSINGS) ×2 IMPLANT
BINDER BREAST XLRG (GAUZE/BANDAGES/DRESSINGS) IMPLANT
BLADE SURG 10 STRL SS (BLADE) ×1 IMPLANT
BLADE SURG 15 STRL LF DISP TIS (BLADE) ×1 IMPLANT
BLADE SURG 15 STRL SS (BLADE) ×3
CANISTER SUCT 3000ML PPV (MISCELLANEOUS) ×2 IMPLANT
CHLORAPREP W/TINT 26ML (MISCELLANEOUS) ×3 IMPLANT
CLIP APPLIE 9.375 MED OPEN (MISCELLANEOUS) IMPLANT
CLOSURE WOUND 1/2 X4 (GAUZE/BANDAGES/DRESSINGS) ×1
CONT SPEC 4OZ CLIKSEAL STRL BL (MISCELLANEOUS) ×6 IMPLANT
COVER PROBE W GEL 5X96 (DRAPES) ×3 IMPLANT
COVER SURGICAL LIGHT HANDLE (MISCELLANEOUS) ×3 IMPLANT
DERMABOND ADVANCED (GAUZE/BANDAGES/DRESSINGS) ×2
DERMABOND ADVANCED .7 DNX12 (GAUZE/BANDAGES/DRESSINGS) ×1 IMPLANT
DEVICE DUBIN SPECIMEN MAMMOGRA (MISCELLANEOUS) ×3 IMPLANT
DRAPE CHEST BREAST 15X10 FENES (DRAPES) ×3 IMPLANT
DRAPE UTILITY XL STRL (DRAPES) ×3 IMPLANT
ELECT COATED BLADE 2.86 ST (ELECTRODE) ×3 IMPLANT
ELECT REM PT RETURN 9FT ADLT (ELECTROSURGICAL) ×3
ELECTRODE REM PT RTRN 9FT ADLT (ELECTROSURGICAL) ×1 IMPLANT
GLOVE BIO SURGEON STRL SZ7 (GLOVE) ×6 IMPLANT
GLOVE BIOGEL PI IND STRL 6.5 (GLOVE) IMPLANT
GLOVE BIOGEL PI IND STRL 7.5 (GLOVE) ×1 IMPLANT
GLOVE BIOGEL PI INDICATOR 6.5 (GLOVE) ×2
GLOVE BIOGEL PI INDICATOR 7.5 (GLOVE) ×2
GLOVE ECLIPSE 6.0 STRL STRAW (GLOVE) ×2 IMPLANT
GOWN STRL REUS W/ TWL LRG LVL3 (GOWN DISPOSABLE) ×2 IMPLANT
GOWN STRL REUS W/TWL LRG LVL3 (GOWN DISPOSABLE) ×6
HEMOSTAT ARISTA ABSORB 3G PWDR (MISCELLANEOUS) ×2 IMPLANT
KIT BASIN OR (CUSTOM PROCEDURE TRAY) ×3 IMPLANT
KIT MARKER MARGIN INK (KITS) ×3 IMPLANT
MARKER SKIN DUAL TIP RULER LAB (MISCELLANEOUS) ×3 IMPLANT
NDL HYPO 25X1 1.5 SAFETY (NEEDLE) ×1 IMPLANT
NEEDLE HYPO 25X1 1.5 SAFETY (NEEDLE) ×3 IMPLANT
NS IRRIG 1000ML POUR BTL (IV SOLUTION) ×2 IMPLANT
PACK SURGICAL SETUP 50X90 (CUSTOM PROCEDURE TRAY) ×3 IMPLANT
PENCIL BUTTON HOLSTER BLD 10FT (ELECTRODE) ×3 IMPLANT
SPONGE LAP 18X18 X RAY DECT (DISPOSABLE) ×3 IMPLANT
STRIP CLOSURE SKIN 1/2X4 (GAUZE/BANDAGES/DRESSINGS) ×2 IMPLANT
SUT MNCRL AB 4-0 PS2 18 (SUTURE) ×3 IMPLANT
SUT SILK 2 0 SH (SUTURE) ×2 IMPLANT
SUT VIC AB 2-0 SH 27 (SUTURE) ×3
SUT VIC AB 2-0 SH 27XBRD (SUTURE) ×1 IMPLANT
SUT VIC AB 3-0 SH 27 (SUTURE) ×3
SUT VIC AB 3-0 SH 27X BRD (SUTURE) ×1 IMPLANT
SYR BULB 3OZ (MISCELLANEOUS) ×3 IMPLANT
SYR CONTROL 10ML LL (SYRINGE) ×3 IMPLANT
TOWEL OR 17X24 6PK STRL BLUE (TOWEL DISPOSABLE) ×3 IMPLANT
TOWEL OR 17X26 10 PK STRL BLUE (TOWEL DISPOSABLE) ×1 IMPLANT
TUBE CONNECTING 12'X1/4 (SUCTIONS) ×1
TUBE CONNECTING 12X1/4 (SUCTIONS) ×1 IMPLANT
YANKAUER SUCT BULB TIP NO VENT (SUCTIONS) ×2 IMPLANT

## 2017-01-23 NOTE — Op Note (Signed)
Preoperative diagnosis: Rightbreast cancer, clinical stage I Postoperative diagnosis: same as above Procedure: Right breast seed guided lumpectomy Surgeon Dr Serita Grammes Anes general  EBL: minimal Comps none Specimen:  1. Rightbreast marked with paint containing seed and clip 2. Additional superior, inferior and lateral marginsmarked short superior, long lateral, double deep Sponge count correct at completion Dispoto recovery stable  Indications: This is a 77 yof with stage I right breast cancer.. We have decided to proceed with lumpectomy.Her clip had migrated and the calcifications were localized. She had seed placed prior to beginning. I had these mm in the OR.   Procedure: After informed consent was obtained the patient was taken to the OR. SCDs were in place. She was prepped and draped in the standard sterile surgical fashion. A timeout was performed. She had undergone a pectoral block.  I then located the seed in the right lateral breast. I infiltrated marcaine in the lateral breast and made a curvilinear incision.  I then removed the seedwith an attempt to get a clear margin. The posterior margin is the pectoralis. This waspassed off the table after marking with paint.I did confirm removal of theclipand seed with mammography.I thought superior, medial and inferior margins might be close so I removed additional margins and marked as above.  I obtained hemostasis. I placed clips in this cavity. I then closed with 2-0 vicryl, 3-0 vicryl and 4-0 monocryl. Glue and steristrips were applied.

## 2017-01-23 NOTE — Anesthesia Postprocedure Evaluation (Signed)
Anesthesia Post Note  Patient: [name]  Procedure(s) Performed: Procedure(s) (LRB): RADIOACTIVE SEED GUIDED RIGHT BREAST LUMPECTOMY (Right)  Patient location during evaluation: PACU Anesthesia Type: General Level of consciousness: awake and alert Pain management: pain level controlled Vital Signs Assessment: post-procedure vital signs reviewed and stable Respiratory status: spontaneous breathing, nonlabored ventilation, respiratory function stable and patient connected to nasal cannula oxygen Cardiovascular status: blood pressure returned to baseline and stable Postop Assessment: no signs of nausea or vomiting Anesthetic complications: no       Last Vitals:  Vitals:   01/23/17 1100 01/23/17 1107  BP:  (!) 141/75  Pulse: 77 75  Resp: 13 13  Temp:      Last Pain:  Vitals:   01/23/17 1045  TempSrc:   PainSc: 0-No pain                 Tiajuana Amass

## 2017-01-23 NOTE — Anesthesia Procedure Notes (Signed)
Procedure Name: LMA Insertion Date/Time: 01/23/2017 8:40 AM Performed by: Valda Favia Pre-anesthesia Checklist: Patient identified, Emergency Drugs available, Suction available, Patient being monitored and Timeout performed Patient Re-evaluated:Patient Re-evaluated prior to inductionOxygen Delivery Method: Circle system utilized Preoxygenation: Pre-oxygenation with 100% oxygen Intubation Type: IV induction LMA: LMA inserted LMA Size: 4.0 Number of attempts: 1 Placement Confirmation: positive ETCO2 and breath sounds checked- equal and bilateral Tube secured with: Tape Dental Injury: Teeth and Oropharynx as per pre-operative assessment

## 2017-01-23 NOTE — H&P (Signed)
83 yof referred by Dr Augustin Coupe for newly diagnosed right breast cancer. she has no prior history except for benign excisional biopsy on left. no personal history of breast cancer. she has no mass or dc on exam. this was just routine screening exam. she actually went with her daughter to get exam and her daughter was diagnosed with dcis also. she she has density b breasts. on mm there is possible mass and microcalcs in the right breast. on [place] this a 1 cm irregular hypoechoic mass in the right breast 10 oclock 6 cm from nipple. [place] of axilla is negative. biopsy of this area times two is "at least" dcis with papillary features. invasion cannot be excluded on these specimens. this is er/per negative. she is here with her daughter to discuss options   Past Surgical History [name], [name]; 01/04/2017 12:20 PM) Appendectomy  Breast Biopsy  Bilateral. Cataract Surgery  Bilateral. Gallbladder Surgery - Open  Hysterectomy (not due to cancer) - Partial  Oral Surgery  Tonsillectomy   Diagnostic Studies History [name], [name]; 01/04/2017 12:20 PM) Colonoscopy  5-10 years ago Mammogram  within last year Pap Smear  1-5 years ago  Allergies [name], [name]; 01/04/2017 12:22 PM) Codeine Phosphate *ANALGESICS - OPIOID*  Penicillins  Sulfa Antibiotics   Medication History [name], [name]; 01/04/2017 12:27 PM) Bethanechol Chloride (10MG  Tablet, Oral) Active. LevETIRAcetam (750MG  Tablet, Oral) Active. Lyrica (50MG  Capsule, Oral) Active. Telmisartan (40MG  Tablet, Oral) Active. PARoxetine HCl (10MG  Tablet, Oral) Active. Rapaflo (4MG  Capsule, Oral) Active. Anusol-HC (2.5% Cream, Rectal as needed) Active. Baby Aspirin (81MG  Tablet Chewable, Oral) Active. ClonazePAM (0.5MG  Tablet, Oral two times daily) Active. Lotrisone (1-0.05% Cream, External twice a week) Active. Macrodantin (50MG  Capsule, Oral daily) Active. NexIUM (40MG  Packet, Oral)  Active. Tylenol (500MG  Capsule, Oral) Active. Xyzal (5MG  Tablet, Oral daily) Active. B-Complex High Potency (Oral) Specific strength unknown - Active. Glucosamine (Oral) Specific strength unknown - Active. Fish Oil + D3 (Oral) Specific strength unknown - Active. Probiotic (Oral daily) Active. Medications Reconciled  Social History [name], [name]; 01/04/2017 12:20 PM) No alcohol use  No caffeine use  No drug use  Tobacco use  Never smoker.  Family History [name], [name]; 01/04/2017 12:20 PM) Arthritis  Mother. Cancer  Sister. Colon Cancer  Mother. Diabetes Mellitus  Brother, Sister. Heart Disease  Father, Sister. Heart disease in female family member before age 34  Ischemic Bowel Disease  Mother. Rectal Cancer  Mother.  Pregnancy / Birth History [name], [name]; 01/04/2017 12:20 PM) Age at menarche  14 years. Age of menopause  23-50 Gravida  3 Irregular periods  Length (months) of breastfeeding  3-6 Maternal age  2-25 Para  3  Other Problems [name], [name]; 01/04/2017 12:20 PM) Anxiety Disorder  Arthritis  Back Pain  Bladder Problems  Cholelithiasis  Gastroesophageal Reflux Disease  Heart murmur  Hemorrhoids  High blood pressure  Hypercholesterolemia  Lump In Breast  Myocardial infarction  Other disease, cancer, significant illness  Seizure Disorder     Review of Systems [name] [name]; 01/04/2017 12:20 PM) General Present- Fatigue. Not Present- Appetite Loss, Chills, Fever, Night Sweats, Weight Gain and Weight Loss. Skin Present- Dryness. Not Present- Change in Wart/Mole, Hives, Jaundice, New Lesions, Non-Healing Wounds, Rash and Ulcer. HEENT Present- Hearing Loss and Wears glasses/contact lenses. Not Present- Earache, Hoarseness, Nose Bleed, Oral Ulcers, Ringing in the Ears, Seasonal Allergies, Sinus Pain, Sore Throat, Visual Disturbances and Yellow Eyes. Respiratory Present- Chronic  Cough. Not Present- Bloody sputum, Difficulty  Breathing, Snoring and Wheezing. Breast Present- Breast Mass. Not Present- Breast Pain, Nipple Discharge and Skin Changes. Gastrointestinal Present- Chronic diarrhea and Constipation. Not Present- Abdominal Pain, Bloating, Bloody Stool, Change in Bowel Habits, Difficulty Swallowing, Excessive gas, Gets full quickly at meals, Hemorrhoids, Indigestion, Nausea, Rectal Pain and Vomiting. Female Genitourinary Present- Urgency. Not Present- Frequency, Nocturia, Painful Urination and Pelvic Pain. Musculoskeletal Present- Back Pain and Joint Stiffness. Not Present- Joint Pain, Muscle Pain, Muscle Weakness and Swelling of Extremities. Neurological Present- Decreased Memory, Seizures and Weakness. Not Present- Fainting, Headaches, Numbness, Tingling, Tremor and Trouble walking. Psychiatric Present- Anxiety. Not Present- Bipolar, Change in Sleep Pattern, Depression, Fearful and Frequent crying. Endocrine Present- Cold Intolerance. Not Present- Excessive Hunger, Hair Changes, Heat Intolerance, Hot flashes and New Diabetes. Hematology Not Present- Blood Thinners, Easy Bruising, Excessive bleeding, Gland problems, HIV and Persistent Infections.  Vitals [name] [name]; 01/04/2017 12:28 PM) 01/04/2017 12:27 PM Weight: 131.4 lb Height: 58in Body Surface Area: 1.52 m Body Mass Index: 27.46 kg/m  Temp.: 97.27F  Pulse: 63 (Regular)  BP: 180/74 (Sitting, Left Arm, Standard)       Physical Exam [name]; 01/04/2017 8:07 PM) General Mental Status-Alert. Orientation-Oriented X3.  Chest and Lung Exam Chest and lung exam reveals -on auscultation, normal breath sounds, no adventitious sounds and normal vocal resonance.  Breast Nipples-No Discharge. Breast Lump-No Palpable Breast Mass. Note: healed left breast incision   Cardiovascular Cardiovascular examination reveals -normal heart sounds, regular rate and rhythm  with no murmurs.  Lymphatic Head & Neck  General Head & Neck Lymphatics: Bilateral - Description - Normal. Note: no [place] adenopathy     Assessment & Plan [name]; 01/04/2017 8:17 PM) BREAST CANCER OF LOWER-OUTER QUADRANT OF RIGHT FEMALE BREAST (C50.511) Story: Right breast seed guided lumpectomy We discussed the staging and pathophysiology of breast cancer. We discussed all of the different options for treatment for breast cancer including surgery, chemotherapy, radiation therapy, Herceptin, and antiestrogen therapy. I dont think she needs a sn biopsy as I dont think would change her mgt. I do not think she will get chemotherapy. radiation is possibility with bct. I do think she likely has invasive disease given the mass on [place] along with path result. We discussed the options for treatment of the breast cancer which included lumpectomy versus a mastectomy. We discussed the performance of the lumpectomy with radioactive seed placement. We discussed a 5-10% chance of a positive margin requiring reexcision in the operating room. We also discussed that she will need radiation therapy if she undergoes lumpectomy. We discussed the mastectomy (removal of whole breast) and the postoperative care for that as well. Mastectomy can be followed by reconstruction. The decision for lumpectomy vs mastectomy has no impact on decision for chemotherapy. Most mastectomy patients will not need radiation therapy. We discussed that there is no difference in her survival whether she undergoes lumpectomy with radiation therapy or antiestrogen therapy versus a mastectomy. There is also no real difference between her recurrence in the breast. I think lumpectomy is best option for her given age etc. will plan on doing seed guided lumpectomy once I discuss with her cardiologist and neurologist. Will send her daughter for genetic testing. We discussed the risks of operation including bleeding, infection, possible  reoperation. She understands her further therapy will be based on what her stages at the time of her operation.

## 2017-01-23 NOTE — Interval H&P Note (Signed)
History and Physical Interval Note:  01/23/2017 6:56 AM  [name]  has presented today for surgery, with the diagnosis of RIGHT BREAST CANCER  The various methods of treatment have been discussed with the patient and family. After consideration of risks, benefits and other options for treatment, the patient has consented to  Procedure(s): BREAST LUMPECTOMY WITH RADIOACTIVE SEED LOCALIZATION (Right) as a surgical intervention .  The patient's history has been reviewed, patient examined, no change in status, stable for surgery.  I have reviewed the patient's chart and labs.  Questions were answered to the patient's satisfaction.     Alishba [place]

## 2017-01-23 NOTE — Discharge Instructions (Signed)
[facility] °Office Phone Number [phone] °POST OP INSTRUCTIONS ° °Always review your discharge instruction sheet given to you by the facility where your surgery was performed. ° °IF YOU HAVE DISABILITY OR FAMILY LEAVE FORMS, YOU MUST BRING THEM TO THE OFFICE FOR PROCESSING.  DO NOT GIVE THEM TO YOUR DOCTOR. ° °1. A prescription for pain medication may be given to you upon discharge.  Take your pain medication as prescribed, if needed.  If narcotic pain medicine is not needed, then you may take acetaminophen (Tylenol), naprosyn (Alleve) or ibuprofen (Advil) as needed. °2. Take your usually prescribed medications unless otherwise directed °3. If you need a refill on your pain medication, please contact your pharmacy.  They will contact our office to request authorization.  Prescriptions will not be filled after 5pm or on week-ends. °4. You should eat very light the first 24 hours after surgery, such as soup, crackers, pudding, etc.  Resume your normal diet the day after surgery. °5. Most patients will experience some swelling and bruising in the breast.  Ice packs and a good support bra will help.  Wear the breast binder provided or a sports bra for 72 hours day and night.  After that wear a sports bra during the day until you return to the office. Swelling and bruising can take several days to resolve.  °6. It is common to experience some constipation if taking pain medication after surgery.  Increasing fluid intake and taking a stool softener will usually help or prevent this problem from occurring.  A mild laxative (Milk of Magnesia or Miralax) should be taken according to package directions if there are no bowel movements after 48 hours. °7. Unless discharge instructions indicate otherwise, you may remove your bandages 48 hours after surgery and you may shower at that time.  You may have steri-strips (small skin tapes) in place directly over the incision.  These strips should be left on the  skin for 7-10 days and will come off on their own.  If your surgeon used skin glue on the incision, you may shower in 24 hours.  The glue will flake off over the next 2-3 weeks.  Any sutures or staples will be removed at the office during your follow-up visit. °8. ACTIVITIES:  You may resume regular daily activities (gradually increasing) beginning the next day.  Wearing a good support bra or sports bra minimizes pain and swelling.  You may have sexual intercourse when it is comfortable. °a. You may drive when you no longer are taking prescription pain medication, you can comfortably wear a seatbelt, and you can safely maneuver your car and apply brakes. °b. RETURN TO WORK:  ______________________________________________________________________________________ °9. You should see your doctor in the office for a follow-up appointment approximately two weeks after your surgery.  Your doctor’s nurse will typically make your follow-up appointment when she calls you with your pathology report.  Expect your pathology report 3-4 business days after your surgery.  You may call to check if you do not hear from us after three days. °10. OTHER INSTRUCTIONS: _______________________________________________________________________________________________ _____________________________________________________________________________________________________________________________________ °_____________________________________________________________________________________________________________________________________ °_____________________________________________________________________________________________________________________________________ ° °WHEN TO CALL DR Kerstie Agent: °1. Fever over 101.0 °2. Nausea and/or vomiting. °3. Extreme swelling or bruising. °4. Continued bleeding from incision. °5. Increased pain, redness, or drainage from the incision. ° °The clinic staff is available to answer your questions during regular  business hours.  Please don’t hesitate to call and ask to speak to one of the nurses for clinical concerns.  If you   have a medical emergency, go to the nearest emergency room or call 911.  A surgeon from [facility] is always on call at the hospital. ° °For further questions, please visit [email or web address] ° °

## 2017-01-23 NOTE — Transfer of Care (Signed)
Immediate Anesthesia Transfer of Care Note  Patient: [name]  Procedure(s) Performed: Procedure(s): RADIOACTIVE SEED GUIDED RIGHT BREAST LUMPECTOMY (Right)  Patient Location: PACU  Anesthesia Type:General  Level of Consciousness: sedated  Airway & Oxygen Therapy: Patient Spontanous Breathing and Patient connected to nasal cannula oxygen  Post-op Assessment: Report given to RN and Post -op Vital signs reviewed and stable  Post vital signs: Reviewed and stable  Last Vitals:  Vitals:   01/23/17 0720 01/23/17 0942  BP: (!) 166/62   Pulse: 73   Resp: 16   Temp: 36.9 C (P) 36.2 C    Last Pain:  Vitals:   01/23/17 0720  TempSrc: Oral      Patients Stated Pain Goal: 3 (29/19/16 6060)  Complications: No apparent anesthesia complications

## 2017-01-24 ENCOUNTER — Encounter (HOSPITAL_COMMUNITY): Payer: Self-pay | Admitting: General Surgery

## 2017-02-18 DIAGNOSIS — H353132 Nonexudative age-related macular degeneration, bilateral, intermediate dry stage: Secondary | ICD-10-CM | POA: Diagnosis not present

## 2017-02-22 DIAGNOSIS — I10 Essential (primary) hypertension: Secondary | ICD-10-CM | POA: Diagnosis not present

## 2017-02-22 DIAGNOSIS — R509 Fever, unspecified: Secondary | ICD-10-CM | POA: Diagnosis not present

## 2017-02-22 DIAGNOSIS — K219 Gastro-esophageal reflux disease without esophagitis: Secondary | ICD-10-CM | POA: Diagnosis present

## 2017-02-22 DIAGNOSIS — Z880 Allergy status to penicillin: Secondary | ICD-10-CM | POA: Diagnosis not present

## 2017-02-22 DIAGNOSIS — G92 Toxic encephalopathy: Secondary | ICD-10-CM | POA: Diagnosis present

## 2017-02-22 DIAGNOSIS — S0990XA Unspecified injury of head, initial encounter: Secondary | ICD-10-CM | POA: Diagnosis not present

## 2017-02-22 DIAGNOSIS — G9340 Encephalopathy, unspecified: Secondary | ICD-10-CM | POA: Diagnosis not present

## 2017-02-22 DIAGNOSIS — I629 Nontraumatic intracranial hemorrhage, unspecified: Secondary | ICD-10-CM | POA: Diagnosis not present

## 2017-02-22 DIAGNOSIS — I429 Cardiomyopathy, unspecified: Secondary | ICD-10-CM | POA: Diagnosis not present

## 2017-02-22 DIAGNOSIS — I619 Nontraumatic intracerebral hemorrhage, unspecified: Secondary | ICD-10-CM | POA: Insufficient documentation

## 2017-02-22 DIAGNOSIS — S80211A Abrasion, right knee, initial encounter: Secondary | ICD-10-CM | POA: Diagnosis not present

## 2017-02-22 DIAGNOSIS — I618 Other nontraumatic intracerebral hemorrhage: Secondary | ICD-10-CM | POA: Diagnosis not present

## 2017-02-22 DIAGNOSIS — R5381 Other malaise: Secondary | ICD-10-CM | POA: Insufficient documentation

## 2017-02-22 DIAGNOSIS — M6281 Muscle weakness (generalized): Secondary | ICD-10-CM | POA: Diagnosis not present

## 2017-02-22 DIAGNOSIS — C50919 Malignant neoplasm of unspecified site of unspecified female breast: Secondary | ICD-10-CM | POA: Diagnosis not present

## 2017-02-22 DIAGNOSIS — R5081 Fever presenting with conditions classified elsewhere: Secondary | ICD-10-CM | POA: Diagnosis not present

## 2017-02-22 DIAGNOSIS — T8131XA Disruption of external operation (surgical) wound, not elsewhere classified, initial encounter: Secondary | ICD-10-CM | POA: Diagnosis not present

## 2017-02-22 DIAGNOSIS — L0390 Cellulitis, unspecified: Secondary | ICD-10-CM | POA: Diagnosis not present

## 2017-02-22 DIAGNOSIS — N6311 Unspecified lump in the right breast, upper outer quadrant: Secondary | ICD-10-CM | POA: Diagnosis not present

## 2017-02-22 DIAGNOSIS — Z8249 Family history of ischemic heart disease and other diseases of the circulatory system: Secondary | ICD-10-CM | POA: Diagnosis not present

## 2017-02-22 DIAGNOSIS — S50312A Abrasion of left elbow, initial encounter: Secondary | ICD-10-CM | POA: Diagnosis present

## 2017-02-22 DIAGNOSIS — B9561 Methicillin susceptible Staphylococcus aureus infection as the cause of diseases classified elsewhere: Secondary | ICD-10-CM | POA: Diagnosis present

## 2017-02-22 DIAGNOSIS — J90 Pleural effusion, not elsewhere classified: Secondary | ICD-10-CM | POA: Diagnosis not present

## 2017-02-22 DIAGNOSIS — A4901 Methicillin susceptible Staphylococcus aureus infection, unspecified site: Secondary | ICD-10-CM | POA: Diagnosis not present

## 2017-02-22 DIAGNOSIS — R569 Unspecified convulsions: Secondary | ICD-10-CM | POA: Diagnosis not present

## 2017-02-22 DIAGNOSIS — D72829 Elevated white blood cell count, unspecified: Secondary | ICD-10-CM | POA: Diagnosis not present

## 2017-02-22 DIAGNOSIS — W19XXXA Unspecified fall, initial encounter: Secondary | ICD-10-CM | POA: Diagnosis not present

## 2017-02-22 DIAGNOSIS — G4089 Other seizures: Secondary | ICD-10-CM | POA: Diagnosis not present

## 2017-02-22 DIAGNOSIS — I341 Nonrheumatic mitral (valve) prolapse: Secondary | ICD-10-CM | POA: Diagnosis not present

## 2017-02-22 DIAGNOSIS — Z853 Personal history of malignant neoplasm of breast: Secondary | ICD-10-CM | POA: Diagnosis not present

## 2017-02-22 DIAGNOSIS — R748 Abnormal levels of other serum enzymes: Secondary | ICD-10-CM | POA: Diagnosis not present

## 2017-02-22 DIAGNOSIS — R54 Age-related physical debility: Secondary | ICD-10-CM | POA: Diagnosis not present

## 2017-02-22 DIAGNOSIS — G40909 Epilepsy, unspecified, not intractable, without status epilepticus: Secondary | ICD-10-CM | POA: Diagnosis not present

## 2017-02-22 DIAGNOSIS — F341 Dysthymic disorder: Secondary | ICD-10-CM | POA: Diagnosis present

## 2017-02-22 DIAGNOSIS — S79919A Unspecified injury of unspecified hip, initial encounter: Secondary | ICD-10-CM | POA: Diagnosis not present

## 2017-02-22 DIAGNOSIS — I2510 Atherosclerotic heart disease of native coronary artery without angina pectoris: Secondary | ICD-10-CM | POA: Diagnosis not present

## 2017-02-22 DIAGNOSIS — L7633 Postprocedural seroma of skin and subcutaneous tissue following a dermatologic procedure: Secondary | ICD-10-CM | POA: Diagnosis present

## 2017-02-22 DIAGNOSIS — S062X0A Diffuse traumatic brain injury without loss of consciousness, initial encounter: Secondary | ICD-10-CM | POA: Diagnosis present

## 2017-02-22 DIAGNOSIS — I119 Hypertensive heart disease without heart failure: Secondary | ICD-10-CM | POA: Diagnosis not present

## 2017-02-22 DIAGNOSIS — S80212A Abrasion, left knee, initial encounter: Secondary | ICD-10-CM | POA: Diagnosis not present

## 2017-02-22 DIAGNOSIS — J449 Chronic obstructive pulmonary disease, unspecified: Secondary | ICD-10-CM | POA: Diagnosis not present

## 2017-02-22 DIAGNOSIS — T07XXXA Unspecified multiple injuries, initial encounter: Secondary | ICD-10-CM | POA: Diagnosis not present

## 2017-02-22 DIAGNOSIS — I214 Non-ST elevation (NSTEMI) myocardial infarction: Secondary | ICD-10-CM | POA: Diagnosis not present

## 2017-02-22 DIAGNOSIS — M7551 Bursitis of right shoulder: Secondary | ICD-10-CM | POA: Diagnosis present

## 2017-02-22 DIAGNOSIS — T814XXA Infection following a procedure, initial encounter: Secondary | ICD-10-CM | POA: Diagnosis present

## 2017-02-22 DIAGNOSIS — S21001A Unspecified open wound of right breast, initial encounter: Secondary | ICD-10-CM | POA: Diagnosis not present

## 2017-02-22 DIAGNOSIS — I5181 Takotsubo syndrome: Secondary | ICD-10-CM | POA: Diagnosis not present

## 2017-02-22 DIAGNOSIS — N61 Mastitis without abscess: Secondary | ICD-10-CM | POA: Diagnosis not present

## 2017-02-22 DIAGNOSIS — S0003XA Contusion of scalp, initial encounter: Secondary | ICD-10-CM | POA: Diagnosis present

## 2017-02-22 DIAGNOSIS — S50311A Abrasion of right elbow, initial encounter: Secondary | ICD-10-CM | POA: Diagnosis present

## 2017-02-22 DIAGNOSIS — S0091XA Abrasion of unspecified part of head, initial encounter: Secondary | ICD-10-CM | POA: Diagnosis not present

## 2017-02-22 DIAGNOSIS — N6313 Unspecified lump in the right breast, lower outer quadrant: Secondary | ICD-10-CM | POA: Diagnosis not present

## 2017-02-22 DIAGNOSIS — E785 Hyperlipidemia, unspecified: Secondary | ICD-10-CM | POA: Diagnosis not present

## 2017-02-22 DIAGNOSIS — S0081XA Abrasion of other part of head, initial encounter: Secondary | ICD-10-CM | POA: Diagnosis not present

## 2017-02-25 DIAGNOSIS — R509 Fever, unspecified: Secondary | ICD-10-CM | POA: Insufficient documentation

## 2017-03-02 DIAGNOSIS — S21001A Unspecified open wound of right breast, initial encounter: Secondary | ICD-10-CM | POA: Insufficient documentation

## 2017-03-02 DIAGNOSIS — A4901 Methicillin susceptible Staphylococcus aureus infection, unspecified site: Secondary | ICD-10-CM | POA: Insufficient documentation

## 2017-03-02 DIAGNOSIS — N61 Mastitis without abscess: Secondary | ICD-10-CM | POA: Insufficient documentation

## 2017-03-03 DIAGNOSIS — S79919A Unspecified injury of unspecified hip, initial encounter: Secondary | ICD-10-CM | POA: Diagnosis not present

## 2017-03-03 DIAGNOSIS — R262 Difficulty in walking, not elsewhere classified: Secondary | ICD-10-CM | POA: Diagnosis not present

## 2017-03-03 DIAGNOSIS — E785 Hyperlipidemia, unspecified: Secondary | ICD-10-CM | POA: Diagnosis not present

## 2017-03-03 DIAGNOSIS — R54 Age-related physical debility: Secondary | ICD-10-CM | POA: Diagnosis not present

## 2017-03-03 DIAGNOSIS — R569 Unspecified convulsions: Secondary | ICD-10-CM | POA: Diagnosis not present

## 2017-03-03 DIAGNOSIS — W19XXXA Unspecified fall, initial encounter: Secondary | ICD-10-CM | POA: Diagnosis not present

## 2017-03-03 DIAGNOSIS — R079 Chest pain, unspecified: Secondary | ICD-10-CM | POA: Diagnosis not present

## 2017-03-03 DIAGNOSIS — I10 Essential (primary) hypertension: Secondary | ICD-10-CM | POA: Diagnosis not present

## 2017-03-03 DIAGNOSIS — K219 Gastro-esophageal reflux disease without esophagitis: Secondary | ICD-10-CM | POA: Diagnosis not present

## 2017-03-03 DIAGNOSIS — M6281 Muscle weakness (generalized): Secondary | ICD-10-CM | POA: Diagnosis not present

## 2017-03-03 DIAGNOSIS — I214 Non-ST elevation (NSTEMI) myocardial infarction: Secondary | ICD-10-CM | POA: Diagnosis not present

## 2017-03-03 DIAGNOSIS — G40409 Other generalized epilepsy and epileptic syndromes, not intractable, without status epilepticus: Secondary | ICD-10-CM | POA: Diagnosis not present

## 2017-03-03 DIAGNOSIS — I429 Cardiomyopathy, unspecified: Secondary | ICD-10-CM | POA: Diagnosis not present

## 2017-03-03 DIAGNOSIS — R4701 Aphasia: Secondary | ICD-10-CM | POA: Diagnosis not present

## 2017-03-03 DIAGNOSIS — Z8669 Personal history of other diseases of the nervous system and sense organs: Secondary | ICD-10-CM | POA: Diagnosis not present

## 2017-03-03 DIAGNOSIS — I618 Other nontraumatic intracerebral hemorrhage: Secondary | ICD-10-CM | POA: Diagnosis not present

## 2017-03-03 DIAGNOSIS — I6789 Other cerebrovascular disease: Secondary | ICD-10-CM | POA: Diagnosis not present

## 2017-03-04 DIAGNOSIS — I214 Non-ST elevation (NSTEMI) myocardial infarction: Secondary | ICD-10-CM | POA: Diagnosis not present

## 2017-03-04 DIAGNOSIS — R262 Difficulty in walking, not elsewhere classified: Secondary | ICD-10-CM | POA: Diagnosis not present

## 2017-03-04 DIAGNOSIS — E785 Hyperlipidemia, unspecified: Secondary | ICD-10-CM | POA: Diagnosis not present

## 2017-03-04 DIAGNOSIS — I429 Cardiomyopathy, unspecified: Secondary | ICD-10-CM | POA: Diagnosis not present

## 2017-03-09 DIAGNOSIS — R569 Unspecified convulsions: Secondary | ICD-10-CM | POA: Diagnosis not present

## 2017-03-09 DIAGNOSIS — Z8669 Personal history of other diseases of the nervous system and sense organs: Secondary | ICD-10-CM | POA: Diagnosis not present

## 2017-03-09 DIAGNOSIS — G40409 Other generalized epilepsy and epileptic syndromes, not intractable, without status epilepticus: Secondary | ICD-10-CM | POA: Diagnosis not present

## 2017-03-09 DIAGNOSIS — R079 Chest pain, unspecified: Secondary | ICD-10-CM | POA: Diagnosis not present

## 2017-03-18 DIAGNOSIS — I110 Hypertensive heart disease with heart failure: Secondary | ICD-10-CM | POA: Diagnosis not present

## 2017-03-18 DIAGNOSIS — M47817 Spondylosis without myelopathy or radiculopathy, lumbosacral region: Secondary | ICD-10-CM | POA: Diagnosis not present

## 2017-03-18 DIAGNOSIS — G629 Polyneuropathy, unspecified: Secondary | ICD-10-CM | POA: Diagnosis not present

## 2017-03-18 DIAGNOSIS — I222 Subsequent non-ST elevation (NSTEMI) myocardial infarction: Secondary | ICD-10-CM | POA: Diagnosis not present

## 2017-03-18 DIAGNOSIS — I509 Heart failure, unspecified: Secondary | ICD-10-CM | POA: Diagnosis not present

## 2017-03-18 DIAGNOSIS — I2510 Atherosclerotic heart disease of native coronary artery without angina pectoris: Secondary | ICD-10-CM | POA: Diagnosis not present

## 2017-03-19 DIAGNOSIS — I420 Dilated cardiomyopathy: Secondary | ICD-10-CM | POA: Diagnosis not present

## 2017-03-19 DIAGNOSIS — E785 Hyperlipidemia, unspecified: Secondary | ICD-10-CM | POA: Diagnosis not present

## 2017-03-20 DIAGNOSIS — I2510 Atherosclerotic heart disease of native coronary artery without angina pectoris: Secondary | ICD-10-CM | POA: Diagnosis not present

## 2017-03-20 DIAGNOSIS — I222 Subsequent non-ST elevation (NSTEMI) myocardial infarction: Secondary | ICD-10-CM | POA: Diagnosis not present

## 2017-03-20 DIAGNOSIS — M47817 Spondylosis without myelopathy or radiculopathy, lumbosacral region: Secondary | ICD-10-CM | POA: Diagnosis not present

## 2017-03-20 DIAGNOSIS — G629 Polyneuropathy, unspecified: Secondary | ICD-10-CM | POA: Diagnosis not present

## 2017-03-20 DIAGNOSIS — I110 Hypertensive heart disease with heart failure: Secondary | ICD-10-CM | POA: Diagnosis not present

## 2017-03-20 DIAGNOSIS — I509 Heart failure, unspecified: Secondary | ICD-10-CM | POA: Diagnosis not present

## 2017-03-21 DIAGNOSIS — I222 Subsequent non-ST elevation (NSTEMI) myocardial infarction: Secondary | ICD-10-CM | POA: Diagnosis not present

## 2017-03-21 DIAGNOSIS — I509 Heart failure, unspecified: Secondary | ICD-10-CM | POA: Diagnosis not present

## 2017-03-21 DIAGNOSIS — G629 Polyneuropathy, unspecified: Secondary | ICD-10-CM | POA: Diagnosis not present

## 2017-03-21 DIAGNOSIS — I110 Hypertensive heart disease with heart failure: Secondary | ICD-10-CM | POA: Diagnosis not present

## 2017-03-21 DIAGNOSIS — I2510 Atherosclerotic heart disease of native coronary artery without angina pectoris: Secondary | ICD-10-CM | POA: Diagnosis not present

## 2017-03-21 DIAGNOSIS — M47817 Spondylosis without myelopathy or radiculopathy, lumbosacral region: Secondary | ICD-10-CM | POA: Diagnosis not present

## 2017-03-22 DIAGNOSIS — Z6825 Body mass index (BMI) 25.0-25.9, adult: Secondary | ICD-10-CM | POA: Diagnosis not present

## 2017-03-22 DIAGNOSIS — R7309 Other abnormal glucose: Secondary | ICD-10-CM | POA: Diagnosis not present

## 2017-03-22 DIAGNOSIS — I429 Cardiomyopathy, unspecified: Secondary | ICD-10-CM | POA: Diagnosis not present

## 2017-03-22 DIAGNOSIS — I619 Nontraumatic intracerebral hemorrhage, unspecified: Secondary | ICD-10-CM | POA: Diagnosis not present

## 2017-03-25 DIAGNOSIS — G629 Polyneuropathy, unspecified: Secondary | ICD-10-CM | POA: Diagnosis not present

## 2017-03-25 DIAGNOSIS — M47817 Spondylosis without myelopathy or radiculopathy, lumbosacral region: Secondary | ICD-10-CM | POA: Diagnosis not present

## 2017-03-25 DIAGNOSIS — I222 Subsequent non-ST elevation (NSTEMI) myocardial infarction: Secondary | ICD-10-CM | POA: Diagnosis not present

## 2017-03-25 DIAGNOSIS — I509 Heart failure, unspecified: Secondary | ICD-10-CM | POA: Diagnosis not present

## 2017-03-25 DIAGNOSIS — I2510 Atherosclerotic heart disease of native coronary artery without angina pectoris: Secondary | ICD-10-CM | POA: Diagnosis not present

## 2017-03-25 DIAGNOSIS — I110 Hypertensive heart disease with heart failure: Secondary | ICD-10-CM | POA: Diagnosis not present

## 2017-03-26 DIAGNOSIS — I509 Heart failure, unspecified: Secondary | ICD-10-CM | POA: Diagnosis not present

## 2017-03-26 DIAGNOSIS — M47817 Spondylosis without myelopathy or radiculopathy, lumbosacral region: Secondary | ICD-10-CM | POA: Diagnosis not present

## 2017-03-26 DIAGNOSIS — I222 Subsequent non-ST elevation (NSTEMI) myocardial infarction: Secondary | ICD-10-CM | POA: Diagnosis not present

## 2017-03-26 DIAGNOSIS — I110 Hypertensive heart disease with heart failure: Secondary | ICD-10-CM | POA: Diagnosis not present

## 2017-03-26 DIAGNOSIS — G629 Polyneuropathy, unspecified: Secondary | ICD-10-CM | POA: Diagnosis not present

## 2017-03-26 DIAGNOSIS — I2510 Atherosclerotic heart disease of native coronary artery without angina pectoris: Secondary | ICD-10-CM | POA: Diagnosis not present

## 2017-03-28 DIAGNOSIS — I110 Hypertensive heart disease with heart failure: Secondary | ICD-10-CM | POA: Diagnosis not present

## 2017-03-28 DIAGNOSIS — G629 Polyneuropathy, unspecified: Secondary | ICD-10-CM | POA: Diagnosis not present

## 2017-03-28 DIAGNOSIS — I509 Heart failure, unspecified: Secondary | ICD-10-CM | POA: Diagnosis not present

## 2017-03-28 DIAGNOSIS — I222 Subsequent non-ST elevation (NSTEMI) myocardial infarction: Secondary | ICD-10-CM | POA: Diagnosis not present

## 2017-03-28 DIAGNOSIS — M47817 Spondylosis without myelopathy or radiculopathy, lumbosacral region: Secondary | ICD-10-CM | POA: Diagnosis not present

## 2017-03-28 DIAGNOSIS — I2510 Atherosclerotic heart disease of native coronary artery without angina pectoris: Secondary | ICD-10-CM | POA: Diagnosis not present

## 2017-03-29 DIAGNOSIS — I2510 Atherosclerotic heart disease of native coronary artery without angina pectoris: Secondary | ICD-10-CM | POA: Diagnosis not present

## 2017-03-29 DIAGNOSIS — I110 Hypertensive heart disease with heart failure: Secondary | ICD-10-CM | POA: Diagnosis not present

## 2017-03-29 DIAGNOSIS — I222 Subsequent non-ST elevation (NSTEMI) myocardial infarction: Secondary | ICD-10-CM | POA: Diagnosis not present

## 2017-03-29 DIAGNOSIS — M47817 Spondylosis without myelopathy or radiculopathy, lumbosacral region: Secondary | ICD-10-CM | POA: Diagnosis not present

## 2017-03-29 DIAGNOSIS — I509 Heart failure, unspecified: Secondary | ICD-10-CM | POA: Diagnosis not present

## 2017-03-29 DIAGNOSIS — G629 Polyneuropathy, unspecified: Secondary | ICD-10-CM | POA: Diagnosis not present

## 2017-04-01 DIAGNOSIS — G629 Polyneuropathy, unspecified: Secondary | ICD-10-CM | POA: Diagnosis not present

## 2017-04-01 DIAGNOSIS — I2510 Atherosclerotic heart disease of native coronary artery without angina pectoris: Secondary | ICD-10-CM | POA: Diagnosis not present

## 2017-04-01 DIAGNOSIS — I222 Subsequent non-ST elevation (NSTEMI) myocardial infarction: Secondary | ICD-10-CM | POA: Diagnosis not present

## 2017-04-01 DIAGNOSIS — I110 Hypertensive heart disease with heart failure: Secondary | ICD-10-CM | POA: Diagnosis not present

## 2017-04-01 DIAGNOSIS — M47817 Spondylosis without myelopathy or radiculopathy, lumbosacral region: Secondary | ICD-10-CM | POA: Diagnosis not present

## 2017-04-01 DIAGNOSIS — I509 Heart failure, unspecified: Secondary | ICD-10-CM | POA: Diagnosis not present

## 2017-04-02 DIAGNOSIS — I110 Hypertensive heart disease with heart failure: Secondary | ICD-10-CM | POA: Diagnosis not present

## 2017-04-02 DIAGNOSIS — I222 Subsequent non-ST elevation (NSTEMI) myocardial infarction: Secondary | ICD-10-CM | POA: Diagnosis not present

## 2017-04-02 DIAGNOSIS — I2510 Atherosclerotic heart disease of native coronary artery without angina pectoris: Secondary | ICD-10-CM | POA: Diagnosis not present

## 2017-04-02 DIAGNOSIS — G629 Polyneuropathy, unspecified: Secondary | ICD-10-CM | POA: Diagnosis not present

## 2017-04-02 DIAGNOSIS — I509 Heart failure, unspecified: Secondary | ICD-10-CM | POA: Diagnosis not present

## 2017-04-02 DIAGNOSIS — M47817 Spondylosis without myelopathy or radiculopathy, lumbosacral region: Secondary | ICD-10-CM | POA: Diagnosis not present

## 2017-04-03 DIAGNOSIS — I2510 Atherosclerotic heart disease of native coronary artery without angina pectoris: Secondary | ICD-10-CM | POA: Diagnosis not present

## 2017-04-03 DIAGNOSIS — I110 Hypertensive heart disease with heart failure: Secondary | ICD-10-CM | POA: Diagnosis not present

## 2017-04-03 DIAGNOSIS — I222 Subsequent non-ST elevation (NSTEMI) myocardial infarction: Secondary | ICD-10-CM | POA: Diagnosis not present

## 2017-04-03 DIAGNOSIS — G629 Polyneuropathy, unspecified: Secondary | ICD-10-CM | POA: Diagnosis not present

## 2017-04-03 DIAGNOSIS — M47817 Spondylosis without myelopathy or radiculopathy, lumbosacral region: Secondary | ICD-10-CM | POA: Diagnosis not present

## 2017-04-03 DIAGNOSIS — I509 Heart failure, unspecified: Secondary | ICD-10-CM | POA: Diagnosis not present

## 2017-04-04 DIAGNOSIS — M47817 Spondylosis without myelopathy or radiculopathy, lumbosacral region: Secondary | ICD-10-CM | POA: Diagnosis not present

## 2017-04-04 DIAGNOSIS — I2510 Atherosclerotic heart disease of native coronary artery without angina pectoris: Secondary | ICD-10-CM | POA: Diagnosis not present

## 2017-04-04 DIAGNOSIS — I110 Hypertensive heart disease with heart failure: Secondary | ICD-10-CM | POA: Diagnosis not present

## 2017-04-04 DIAGNOSIS — G629 Polyneuropathy, unspecified: Secondary | ICD-10-CM | POA: Diagnosis not present

## 2017-04-04 DIAGNOSIS — I509 Heart failure, unspecified: Secondary | ICD-10-CM | POA: Diagnosis not present

## 2017-04-04 DIAGNOSIS — I222 Subsequent non-ST elevation (NSTEMI) myocardial infarction: Secondary | ICD-10-CM | POA: Diagnosis not present

## 2017-04-08 DIAGNOSIS — I110 Hypertensive heart disease with heart failure: Secondary | ICD-10-CM | POA: Diagnosis not present

## 2017-04-08 DIAGNOSIS — G629 Polyneuropathy, unspecified: Secondary | ICD-10-CM | POA: Diagnosis not present

## 2017-04-08 DIAGNOSIS — I2510 Atherosclerotic heart disease of native coronary artery without angina pectoris: Secondary | ICD-10-CM | POA: Diagnosis not present

## 2017-04-08 DIAGNOSIS — I222 Subsequent non-ST elevation (NSTEMI) myocardial infarction: Secondary | ICD-10-CM | POA: Diagnosis not present

## 2017-04-08 DIAGNOSIS — M47817 Spondylosis without myelopathy or radiculopathy, lumbosacral region: Secondary | ICD-10-CM | POA: Diagnosis not present

## 2017-04-08 DIAGNOSIS — I509 Heart failure, unspecified: Secondary | ICD-10-CM | POA: Diagnosis not present

## 2017-04-09 DIAGNOSIS — G629 Polyneuropathy, unspecified: Secondary | ICD-10-CM | POA: Diagnosis not present

## 2017-04-09 DIAGNOSIS — I509 Heart failure, unspecified: Secondary | ICD-10-CM | POA: Diagnosis not present

## 2017-04-09 DIAGNOSIS — M47817 Spondylosis without myelopathy or radiculopathy, lumbosacral region: Secondary | ICD-10-CM | POA: Diagnosis not present

## 2017-04-09 DIAGNOSIS — I110 Hypertensive heart disease with heart failure: Secondary | ICD-10-CM | POA: Diagnosis not present

## 2017-04-09 DIAGNOSIS — I2510 Atherosclerotic heart disease of native coronary artery without angina pectoris: Secondary | ICD-10-CM | POA: Diagnosis not present

## 2017-04-09 DIAGNOSIS — I222 Subsequent non-ST elevation (NSTEMI) myocardial infarction: Secondary | ICD-10-CM | POA: Diagnosis not present

## 2017-04-10 DIAGNOSIS — M47817 Spondylosis without myelopathy or radiculopathy, lumbosacral region: Secondary | ICD-10-CM | POA: Diagnosis not present

## 2017-04-10 DIAGNOSIS — G629 Polyneuropathy, unspecified: Secondary | ICD-10-CM | POA: Diagnosis not present

## 2017-04-10 DIAGNOSIS — I222 Subsequent non-ST elevation (NSTEMI) myocardial infarction: Secondary | ICD-10-CM | POA: Diagnosis not present

## 2017-04-10 DIAGNOSIS — I2510 Atherosclerotic heart disease of native coronary artery without angina pectoris: Secondary | ICD-10-CM | POA: Diagnosis not present

## 2017-04-10 DIAGNOSIS — I509 Heart failure, unspecified: Secondary | ICD-10-CM | POA: Diagnosis not present

## 2017-04-10 DIAGNOSIS — I110 Hypertensive heart disease with heart failure: Secondary | ICD-10-CM | POA: Diagnosis not present

## 2017-04-12 DIAGNOSIS — I509 Heart failure, unspecified: Secondary | ICD-10-CM | POA: Diagnosis not present

## 2017-04-12 DIAGNOSIS — I222 Subsequent non-ST elevation (NSTEMI) myocardial infarction: Secondary | ICD-10-CM | POA: Diagnosis not present

## 2017-04-12 DIAGNOSIS — I110 Hypertensive heart disease with heart failure: Secondary | ICD-10-CM | POA: Diagnosis not present

## 2017-04-12 DIAGNOSIS — I2510 Atherosclerotic heart disease of native coronary artery without angina pectoris: Secondary | ICD-10-CM | POA: Diagnosis not present

## 2017-04-12 DIAGNOSIS — G629 Polyneuropathy, unspecified: Secondary | ICD-10-CM | POA: Diagnosis not present

## 2017-04-12 DIAGNOSIS — M47817 Spondylosis without myelopathy or radiculopathy, lumbosacral region: Secondary | ICD-10-CM | POA: Diagnosis not present

## 2017-04-17 DIAGNOSIS — I222 Subsequent non-ST elevation (NSTEMI) myocardial infarction: Secondary | ICD-10-CM | POA: Diagnosis not present

## 2017-04-17 DIAGNOSIS — I2510 Atherosclerotic heart disease of native coronary artery without angina pectoris: Secondary | ICD-10-CM | POA: Diagnosis not present

## 2017-04-17 DIAGNOSIS — G629 Polyneuropathy, unspecified: Secondary | ICD-10-CM | POA: Diagnosis not present

## 2017-04-17 DIAGNOSIS — M47817 Spondylosis without myelopathy or radiculopathy, lumbosacral region: Secondary | ICD-10-CM | POA: Diagnosis not present

## 2017-04-17 DIAGNOSIS — I509 Heart failure, unspecified: Secondary | ICD-10-CM | POA: Diagnosis not present

## 2017-04-17 DIAGNOSIS — I110 Hypertensive heart disease with heart failure: Secondary | ICD-10-CM | POA: Diagnosis not present

## 2017-04-18 DIAGNOSIS — I110 Hypertensive heart disease with heart failure: Secondary | ICD-10-CM | POA: Diagnosis not present

## 2017-04-18 DIAGNOSIS — M47817 Spondylosis without myelopathy or radiculopathy, lumbosacral region: Secondary | ICD-10-CM | POA: Diagnosis not present

## 2017-04-18 DIAGNOSIS — G629 Polyneuropathy, unspecified: Secondary | ICD-10-CM | POA: Diagnosis not present

## 2017-04-18 DIAGNOSIS — R339 Retention of urine, unspecified: Secondary | ICD-10-CM | POA: Diagnosis not present

## 2017-04-18 DIAGNOSIS — I509 Heart failure, unspecified: Secondary | ICD-10-CM | POA: Diagnosis not present

## 2017-04-18 DIAGNOSIS — I222 Subsequent non-ST elevation (NSTEMI) myocardial infarction: Secondary | ICD-10-CM | POA: Diagnosis not present

## 2017-04-18 DIAGNOSIS — R300 Dysuria: Secondary | ICD-10-CM | POA: Diagnosis not present

## 2017-04-18 DIAGNOSIS — R1032 Left lower quadrant pain: Secondary | ICD-10-CM | POA: Diagnosis not present

## 2017-04-18 DIAGNOSIS — I2510 Atherosclerotic heart disease of native coronary artery without angina pectoris: Secondary | ICD-10-CM | POA: Diagnosis not present

## 2017-04-18 DIAGNOSIS — N3090 Cystitis, unspecified without hematuria: Secondary | ICD-10-CM | POA: Diagnosis not present

## 2017-05-01 DIAGNOSIS — F0390 Unspecified dementia without behavioral disturbance: Secondary | ICD-10-CM | POA: Diagnosis not present

## 2017-05-01 DIAGNOSIS — F329 Major depressive disorder, single episode, unspecified: Secondary | ICD-10-CM | POA: Diagnosis not present

## 2017-05-01 DIAGNOSIS — K219 Gastro-esophageal reflux disease without esophagitis: Secondary | ICD-10-CM | POA: Diagnosis not present

## 2017-05-01 DIAGNOSIS — J40 Bronchitis, not specified as acute or chronic: Secondary | ICD-10-CM | POA: Diagnosis not present

## 2017-05-01 DIAGNOSIS — J302 Other seasonal allergic rhinitis: Secondary | ICD-10-CM | POA: Diagnosis not present

## 2017-06-11 ENCOUNTER — Ambulatory Visit (INDEPENDENT_AMBULATORY_CARE_PROVIDER_SITE_OTHER): Payer: Medicare Other | Admitting: Cardiology

## 2017-06-11 ENCOUNTER — Encounter: Payer: Self-pay | Admitting: Cardiology

## 2017-06-11 VITALS — BP 116/58 | HR 68 | Resp 10 | Ht 59.0 in | Wt 132.0 lb

## 2017-06-11 DIAGNOSIS — E785 Hyperlipidemia, unspecified: Secondary | ICD-10-CM

## 2017-06-11 DIAGNOSIS — I629 Nontraumatic intracranial hemorrhage, unspecified: Secondary | ICD-10-CM

## 2017-06-11 DIAGNOSIS — I10 Essential (primary) hypertension: Secondary | ICD-10-CM | POA: Diagnosis not present

## 2017-06-11 DIAGNOSIS — I5181 Takotsubo syndrome: Secondary | ICD-10-CM

## 2017-06-11 MED ORDER — CARVEDILOL 3.125 MG PO TABS: 3.1250 mg | ORAL_TABLET | Freq: Two times a day (BID) | ORAL | 3 refills | Status: DC

## 2017-06-11 MED ORDER — PRAVASTATIN SODIUM 20 MG PO TABS: 20.0000 mg | ORAL_TABLET | Freq: Every day | ORAL | 3 refills | Status: DC

## 2017-06-11 NOTE — Progress Notes (Signed)
Cardiology Office Note:    Date:  06/11/2017   ID:  [name], DOB 1927/05/22, MRN [id number]  PCP:  Tracy Broker, MD  Cardiologist:  Tracy Campus, MD    Referring MD: Tracy Broker, MD   Chief Complaint  Patient presents with  . Follow-up  Congestive heart failure  History of Present Illness:    [name] is a 83 y.o. female  with history of cardiomyopathy however latest normalization. She comes for medical follow-up. He complains of being weak And exhausted but according to her daughter she does not do much. She spends her day sitting in the chart. Anytime she has to go somewhere she prefers to stay at home. Denies having chest pain tightness squeezing pressure burning chest. Described to have some cough for last few weeks.  Past Medical History:  Diagnosis Date  . Anemia    "when she was a child"  . Anxiety   . Cough    "chronic cough"  . GERD (gastroesophageal reflux disease)   . Heart murmur    in the past not sure if recent  . History of acute bronchitis   . HTN (hypertension)   . Hx of bladder infections   . Myocardial infarction [facility])    "heart attack from Takotsubo no damage according to cath" in 2010  . Peripheral neuropathy   . Seizure disorder ([place])   . Seizures ([place])    last seizure 2010  . Takotsubo cardiomyopathy     Past Surgical History:  Procedure Laterality Date  . ABDOMINAL HYSTERECTOMY    . BREAST LUMPECTOMY Left    benign  . BREAST LUMPECTOMY WITH RADIOACTIVE SEED LOCALIZATION Right 01/23/2017   Procedure: RADIOACTIVE SEED GUIDED RIGHT BREAST LUMPECTOMY;  Surgeon: Rolm Bookbinder, MD;  Location: [place] [facility];  Service: General;  Laterality: Right;  . CARDIAC CATHETERIZATION  2010   No significant CAD. stress induced cardiomyopathy.   . CARPAL TUNNEL RELEASE Bilateral    2014  . CATARACT EXTRACTION W/ INTRAOCULAR LENS IMPLANT Bilateral   . CHOLECYSTECTOMY    . LAPAROSCOPIC ABDOMINAL EXPLORATION    . TONSILLECTOMY       Current Medications: Current Meds  Medication Sig  . acetaminophen (TYLENOL) 500 MG tablet Take 500-1,000 mg by mouth every 6 (six) hours as needed (for pain/headaches.).   Marland Kitchen bethanechol (URECHOLINE) 10 MG tablet Take 10 mg by mouth daily.  . carboxymethylcellulose (REFRESH) 1 % ophthalmic solution Place 1 drop into both eyes at bedtime.  . Carboxymethylcellulose Sod PF (REFRESH PLUS) 0.5 % SOLN Place 1 drop into both eyes 4 (four) times daily.  . carvedilol (COREG) 3.125 MG tablet Take 1 tablet (3.125 mg total) by mouth 2 (two) times daily with a meal.  . Cholecalciferol (VITAMIN D3) 2000 units TABS Take 1 tablet by mouth daily.  . clonazePAM (KLONOPIN) 0.5 MG tablet Take 0.5 mg by mouth 2 (two) times daily.   . clotrimazole-betamethasone (LOTRISONE) cream Apply 1 application topically 2 (two) times daily as needed (for vaginal discomfort).   . donepezil (ARICEPT) 5 MG tablet Take 5 mg by mouth daily.  . Doxepin HCl (SILENOR) 6 MG TABS Take 3 mg by mouth at bedtime.  Marland Kitchen esomeprazole (NEXIUM) 40 MG capsule Take 40 mg by mouth daily before breakfast.    . estradiol (ESTRACE) 0.1 MG/GM vaginal cream Place 1 Applicatorful vaginally daily as needed (for vaginal discomfort (dryness/itchy)).   Marland Kitchen Glucosamine-Chondroitin (COSAMIN DS PO) Take 1 tablet by mouth 2 (two) times daily.  Marland Kitchen  hydrocortisone (ANUSOL-HC) 2.5 % rectal cream Place 1 application rectally 2 (two) times daily as needed for hemorrhoids or itching.   . levETIRAcetam (KEPPRA) 750 MG tablet Take 750 mg by mouth 2 (two) times daily.  Marland Kitchen levocetirizine (XYZAL) 5 MG tablet Take 5 mg by mouth as needed for allergies. Once a day as needed  . memantine (NAMENDA) 5 MG tablet Take 5 mg by mouth daily.  . Misc Natural Products (GLUCOSAMINE CHOND COMPLEX/MSM PO) Take 1 tablet by mouth 2 (two) times daily.  . Multiple Vitamins-Minerals (MULTIVITAMIN ADULT PO) Take 1 mg by mouth daily.  . Multiple Vitamins-Minerals (PRESERVISION AREDS 2 PO) Take 1  tablet by mouth 2 (two) times daily.  . Omega-3 Fatty Acids (FISH OIL PO) Take 1 capsule by mouth daily.  Marland Kitchen PARoxetine (PAXIL) 10 MG tablet Take 10 mg by mouth daily at 2 PM.   . pravastatin (PRAVACHOL) 20 MG tablet Take 1 tablet (20 mg total) by mouth daily at 6 PM.  . pregabalin (LYRICA) 50 MG capsule Take 50 mg by mouth 2 (two) times daily.   . Probiotic Product (PROBIOTIC FORMULA) CAPS Take 1 capsule by mouth daily.  . silodosin (RAPAFLO) 4 MG CAPS capsule Take 4 mg by mouth daily with breakfast.   . telmisartan (MICARDIS) 40 MG tablet Take 40 mg by mouth daily.    . [DISCONTINUED] carvedilol (COREG) 3.125 MG tablet TAKE ONE TABLET BY MOUTH TWICE DAILY WITH A MEAL.  . [DISCONTINUED] pravastatin (PRAVACHOL) 20 MG tablet Take 20 mg by mouth daily at 6 PM.     Allergies:   Levaquin [levofloxacin]; Codeine; Penicillins; Promethazine; Sulfa antibiotics; and Sulfasalazine   Social History   Social History  . Marital status: Widowed    Spouse name: N/A  . Number of children: 3  . Years of education: N/A   Occupational History  . retired    Social History Main Topics  . Smoking status: Never Smoker  . Smokeless tobacco: Never Used  . Alcohol use No  . Drug use: No  . Sexual activity: Not Asked   Other Topics Concern  . None   Social History Narrative  . None     Family History: The patient's family history includes Arthritis in her mother; Cancer in her mother and unknown relative; Heart attack in her father and unknown relative; Heart failure in her unknown relative. ROS:   Please see the history of present illness.    All 14 point review of systems negative except as described per history of present illness  EKGs/Labs/Other Studies Reviewed:      Recent Labs: 01/17/2017: BUN 10; Creatinine, Ser 0.85; Hemoglobin 13.9; Platelets 182; Potassium 3.7; Sodium 142  Recent Lipid Panel No results found for: CHOL, TRIG, HDL, CHOLHDL, VLDL, LDLCALC, LDLDIRECT  Physical Exam:      VS:  BP (!) 116/58   Pulse 68   Resp 10   Ht 4\' 11"  (1.499 m)   Wt 132 lb (59.9 kg)   BMI 26.66 kg/m     Wt Readings from Last 3 Encounters:  06/11/17 132 lb (59.9 kg)  01/23/17 133 lb (60.3 kg)  01/17/17 133 lb 5 oz (60.5 kg)     GEN:  Well nourished, well developed in no acute distress HEENT: Normal NECK: No JVD; No carotid bruits LYMPHATICS: No lymphadenopathy CARDIAC: RRR, no murmurs, no rubs, no gallops RESPIRATORY:  Clear to auscultation without rales, wheezing or rhonchi  ABDOMEN: Soft, non-tender, non-distended MUSCULOSKELETAL:  No edema; No deformity  SKIN: Warm and dry LOWER EXTREMITIES: no swelling NEUROLOGIC:  Alert and oriented x 3 PSYCHIATRIC:  Normal affect   ASSESSMENT:    1. Takotsubo cardiomyopathy   2. Essential hypertension   3. Dyslipidemia   4. Bleeding, intracranial (HCC)    PLAN:    In order of problems listed above:  1. History of  cardiomyopathy: Doing well from that point review. On appropriate medications that she can tolerate. Favor conservative approach. Vesicle cardiac M showed preserved ejection fraction. 2. Essential hypertension: Blood pressure well-controlled continue present management. 3. Dyslipidemia: We'll call primary care physician to get fasting lipid profile. 4. Intrafamily: The history off. We'll avoid anticoagulation and antiplatelet agents.   Medication Adjustments/Labs and Tests Ordered: Current medicines are reviewed at length with the patient today.  Concerns regarding medicines are outlined above.  No orders of the defined types were placed in this encounter.  Medication changes:  Meds ordered this encounter  Medications  . carvedilol (COREG) 3.125 MG tablet    Sig: Take 1 tablet (3.125 mg total) by mouth 2 (two) times daily with a meal.    Dispense:  180 tablet    Refill:  3  . pravastatin (PRAVACHOL) 20 MG tablet    Sig: Take 1 tablet (20 mg total) by mouth daily at 6 PM.    Dispense:  90 tablet     Refill:  3    Signed, Park Liter, MD, [facility] 06/11/2017 11:32 AM    [place]

## 2017-06-11 NOTE — Patient Instructions (Signed)
Medication Instructions:  Refills have ben sent for your medications requested.  Labwork: None   Testing/Procedures: None   Follow-Up: Your physician recommends that you schedule a follow-up appointment in: 5 months with Dr. Agustin Cree.   Any Other Special Instructions Will Be Listed Below (If Applicable).     If you need a refill on your cardiac medications before your next appointment, please call your pharmacy.

## 2017-07-01 DIAGNOSIS — W57XXXA Bitten or stung by nonvenomous insect and other nonvenomous arthropods, initial encounter: Secondary | ICD-10-CM | POA: Diagnosis not present

## 2017-07-01 DIAGNOSIS — J40 Bronchitis, not specified as acute or chronic: Secondary | ICD-10-CM | POA: Diagnosis not present

## 2017-07-22 DIAGNOSIS — R5383 Other fatigue: Secondary | ICD-10-CM | POA: Diagnosis not present

## 2017-07-22 DIAGNOSIS — R5381 Other malaise: Secondary | ICD-10-CM | POA: Diagnosis not present

## 2017-07-22 DIAGNOSIS — R05 Cough: Secondary | ICD-10-CM | POA: Diagnosis not present

## 2017-07-22 DIAGNOSIS — Z23 Encounter for immunization: Secondary | ICD-10-CM | POA: Diagnosis not present

## 2017-07-27 DIAGNOSIS — J324 Chronic pansinusitis: Secondary | ICD-10-CM | POA: Diagnosis not present

## 2017-07-28 DIAGNOSIS — J40 Bronchitis, not specified as acute or chronic: Secondary | ICD-10-CM | POA: Diagnosis not present

## 2017-07-28 DIAGNOSIS — A419 Sepsis, unspecified organism: Secondary | ICD-10-CM | POA: Diagnosis not present

## 2017-07-28 DIAGNOSIS — R05 Cough: Secondary | ICD-10-CM | POA: Diagnosis not present

## 2017-07-28 DIAGNOSIS — R531 Weakness: Secondary | ICD-10-CM | POA: Diagnosis not present

## 2017-07-28 DIAGNOSIS — J449 Chronic obstructive pulmonary disease, unspecified: Secondary | ICD-10-CM | POA: Diagnosis not present

## 2017-08-01 DIAGNOSIS — J40 Bronchitis, not specified as acute or chronic: Secondary | ICD-10-CM | POA: Diagnosis not present

## 2017-08-07 DIAGNOSIS — N952 Postmenopausal atrophic vaginitis: Secondary | ICD-10-CM | POA: Diagnosis not present

## 2017-08-07 DIAGNOSIS — R339 Retention of urine, unspecified: Secondary | ICD-10-CM | POA: Diagnosis not present

## 2017-08-07 DIAGNOSIS — N3090 Cystitis, unspecified without hematuria: Secondary | ICD-10-CM | POA: Diagnosis not present

## 2017-08-19 DIAGNOSIS — H353132 Nonexudative age-related macular degeneration, bilateral, intermediate dry stage: Secondary | ICD-10-CM | POA: Diagnosis not present

## 2017-08-19 DIAGNOSIS — H26493 Other secondary cataract, bilateral: Secondary | ICD-10-CM | POA: Diagnosis not present

## 2017-08-29 DIAGNOSIS — J40 Bronchitis, not specified as acute or chronic: Secondary | ICD-10-CM | POA: Diagnosis not present

## 2017-09-11 DIAGNOSIS — L600 Ingrowing nail: Secondary | ICD-10-CM | POA: Diagnosis not present

## 2017-09-11 DIAGNOSIS — L03031 Cellulitis of right toe: Secondary | ICD-10-CM | POA: Diagnosis not present

## 2017-09-24 DIAGNOSIS — C50511 Malignant neoplasm of lower-outer quadrant of right female breast: Secondary | ICD-10-CM | POA: Diagnosis not present

## 2017-09-25 DIAGNOSIS — L600 Ingrowing nail: Secondary | ICD-10-CM | POA: Diagnosis not present

## 2017-10-09 DIAGNOSIS — R05 Cough: Secondary | ICD-10-CM | POA: Diagnosis not present

## 2017-10-09 DIAGNOSIS — J40 Bronchitis, not specified as acute or chronic: Secondary | ICD-10-CM | POA: Diagnosis not present

## 2017-10-09 DIAGNOSIS — Z0000 Encounter for general adult medical examination without abnormal findings: Secondary | ICD-10-CM | POA: Diagnosis not present

## 2017-10-29 DIAGNOSIS — J449 Chronic obstructive pulmonary disease, unspecified: Secondary | ICD-10-CM | POA: Diagnosis not present

## 2017-11-25 DIAGNOSIS — C50919 Malignant neoplasm of unspecified site of unspecified female breast: Secondary | ICD-10-CM | POA: Insufficient documentation

## 2017-11-25 DIAGNOSIS — K219 Gastro-esophageal reflux disease without esophagitis: Secondary | ICD-10-CM | POA: Insufficient documentation

## 2017-11-25 DIAGNOSIS — I639 Cerebral infarction, unspecified: Secondary | ICD-10-CM | POA: Insufficient documentation

## 2017-12-22 ENCOUNTER — Other Ambulatory Visit: Payer: Self-pay | Admitting: General Surgery

## 2017-12-22 DIAGNOSIS — C50312 Malignant neoplasm of lower-inner quadrant of left female breast: Secondary | ICD-10-CM

## 2017-12-31 DIAGNOSIS — R062 Wheezing: Secondary | ICD-10-CM | POA: Insufficient documentation

## 2018-01-17 ENCOUNTER — Ambulatory Visit
Admission: RE | Admit: 2018-01-17 | Discharge: 2018-01-17 | Disposition: A | Discharge status: Home / Self Care | Payer: Medicare Other | Source: Ambulatory Visit | Attending: General Surgery | Admitting: General Surgery

## 2018-01-17 DIAGNOSIS — C50312 Malignant neoplasm of lower-inner quadrant of left female breast: Secondary | ICD-10-CM

## 2018-02-07 DIAGNOSIS — J069 Acute upper respiratory infection, unspecified: Secondary | ICD-10-CM | POA: Insufficient documentation

## 2018-06-02 DIAGNOSIS — S52501A Unspecified fracture of the lower end of right radius, initial encounter for closed fracture: Secondary | ICD-10-CM | POA: Insufficient documentation

## 2018-06-17 ENCOUNTER — Telehealth: Payer: Self-pay | Admitting: Cardiology

## 2018-06-17 MED ORDER — PRAVASTATIN SODIUM 20 MG PO TABS: 20.0000 mg | ORAL_TABLET | Freq: Every day | ORAL | 0 refills | Status: DC

## 2018-06-17 NOTE — Telephone Encounter (Signed)
Refill for pravastatin sent to [facility] in [place] to get patient to follow up appointment on 07/03/18.

## 2018-06-17 NOTE — Telephone Encounter (Signed)
[name] Daughter [place] called to schedule [name] for a follow up appointment for 8.22.19 and to also get a refill for Pravasatin Sodium 20 mg 1 Daily.

## 2018-07-03 ENCOUNTER — Encounter: Payer: Self-pay | Admitting: Cardiology

## 2018-07-03 ENCOUNTER — Ambulatory Visit (INDEPENDENT_AMBULATORY_CARE_PROVIDER_SITE_OTHER): Payer: Medicare Other | Admitting: Cardiology

## 2018-07-03 VITALS — BP 122/76 | HR 70 | Ht 59.0 in | Wt 139.0 lb

## 2018-07-03 DIAGNOSIS — I10 Essential (primary) hypertension: Secondary | ICD-10-CM | POA: Diagnosis not present

## 2018-07-03 DIAGNOSIS — Z9181 History of falling: Secondary | ICD-10-CM

## 2018-07-03 DIAGNOSIS — I5181 Takotsubo syndrome: Secondary | ICD-10-CM | POA: Diagnosis not present

## 2018-07-03 MED ORDER — PRAVASTATIN SODIUM 20 MG PO TABS: 20.0000 mg | ORAL_TABLET | Freq: Every day | ORAL | 3 refills | Status: DC

## 2018-07-03 MED ORDER — CARVEDILOL 3.125 MG PO TABS: 3.1250 mg | ORAL_TABLET | Freq: Two times a day (BID) | ORAL | 3 refills | Status: DC

## 2018-07-03 NOTE — Patient Instructions (Signed)
Medication Instructions:  Your physician recommends that you continue on your current medications as directed. Please refer to the Current Medication list given to you today.   Labwork: None  Testing/Procedures: You had an EKG today.   Your physician has requested that you have an echocardiogram. Echocardiography is a painless test that uses sound waves to create images of your heart. It provides your doctor with information about the size and shape of your heart and how well your heart's chambers and valves are working. This procedure takes approximately one hour. There are no restrictions for this procedure.   Follow-Up: Your physician wants you to follow-up in: 5 months. You will receive a reminder letter in the mail two months in advance. If you don't receive a letter, please call our office to schedule the follow-up appointment.   If you need a refill on your cardiac medications before your next appointment, please call your pharmacy.   Thank you for choosing [facility]! Robyne Peers, RN [phone]   Echocardiogram An echocardiogram, or echocardiography, uses sound waves (ultrasound) to produce an image of your heart. The echocardiogram is simple, painless, obtained within a short period of time, and offers valuable information to your health care provider. The images from an echocardiogram can provide information such as:  Evidence of coronary artery disease (CAD).  Heart size.  Heart muscle function.  Heart valve function.  Aneurysm detection.  Evidence of a past heart attack.  Fluid buildup around the heart.  Heart muscle thickening.  Assess heart valve function.  Tell a health care provider about:  Any allergies you have.  All medicines you are taking, including vitamins, herbs, eye drops, creams, and over-the-counter medicines.  Any problems you or family members have had with anesthetic medicines.  Any blood disorders you have.  Any  surgeries you have had.  Any medical conditions you have.  Whether you are pregnant or may be pregnant. What happens before the procedure? No special preparation is needed. Eat and drink normally. What happens during the procedure?  In order to produce an image of your heart, gel will be applied to your chest and a wand-like tool (transducer) will be moved over your chest. The gel will help transmit the sound waves from the transducer. The sound waves will harmlessly bounce off your heart to allow the heart images to be captured in real-time motion. These images will then be recorded.  You may need an IV to receive a medicine that improves the quality of the pictures. What happens after the procedure? You may return to your normal schedule including diet, activities, and medicines, unless your health care provider tells you otherwise. This information is not intended to replace advice given to you by your health care provider. Make sure you discuss any questions you have with your health care provider. Document Released: 10/26/2000 Document Revised: 06/16/2016 Document Reviewed: 07/06/2013 Elsevier Interactive Patient Education  2017 [employer].

## 2018-07-03 NOTE — Progress Notes (Signed)
Cardiology Office Note:    Date:  07/03/2018   ID:  [name], DOB 09/16/1927, MRN [id number]  PCP:  Myrlene Broker, MD  Cardiologist:  Jenne Campus, MD    Referring MD: Myrlene Broker, MD   No chief complaint on file. Doing well  History of Present Illness:    [name] is a 84 y.o. female with history of stress-induced cardia myopathy but normalization.  Also very fragile state.  She is getting early.  And she actually stays with her daughter there is some falls she actually sustained fracture on the right wrist after she fell down.  No chest pain tightness squeezing pressure burning chest.  Her daughter described a situation when she gets up very quickly she gets dizzy.  Past Medical History:  Diagnosis Date  . Anemia    "when she was a child"  . Anxiety   . Cough    "chronic cough"  . GERD (gastroesophageal reflux disease)   . Heart murmur    in the past not sure if recent  . History of acute bronchitis   . HTN (hypertension)   . Hx of bladder infections   . Myocardial infarction [facility])    "heart attack from Takotsubo no damage according to cath" in 2010  . Peripheral neuropathy   . Seizure disorder ([place])   . Seizures ([place])    last seizure 2010  . Takotsubo cardiomyopathy     Past Surgical History:  Procedure Laterality Date  . ABDOMINAL HYSTERECTOMY    . BREAST LUMPECTOMY Left    benign  . BREAST LUMPECTOMY WITH RADIOACTIVE SEED LOCALIZATION Right 01/23/2017   Procedure: RADIOACTIVE SEED GUIDED RIGHT BREAST LUMPECTOMY;  Surgeon: Rolm Bookbinder, MD;  Location: [facility];  Service: General;  Laterality: Right;  . CARDIAC CATHETERIZATION  2010   No significant CAD. stress induced cardiomyopathy.   . CARPAL TUNNEL RELEASE Bilateral    2014  . CATARACT EXTRACTION W/ INTRAOCULAR LENS IMPLANT Bilateral   . CHOLECYSTECTOMY    . LAPAROSCOPIC ABDOMINAL EXPLORATION    . TONSILLECTOMY      Current Medications: Current Meds  Medication Sig  .  acetaminophen (TYLENOL) 500 MG tablet Take 500-1,000 mg by mouth every 6 (six) hours as needed (for pain/headaches.).   Marland Kitchen albuterol (PROVENTIL) (2.5 MG/3ML) 0.083% nebulizer solution USE 1 VIAL IN NEBULIZER EVERY 6 HOURS AS NEEDED FOR WHEEZING  . bethanechol (URECHOLINE) 10 MG tablet Take 10 mg by mouth daily.  . budesonide-formoterol (SYMBICORT) 80-4.5 MCG/ACT inhaler Inhale 2 puffs into the lungs 2 (two) times daily.  . Carboxymethylcellulose Sod PF (REFRESH PLUS) 0.5 % SOLN Place 1 drop into both eyes 4 (four) times daily.  . carvedilol (COREG) 3.125 MG tablet Take 1 tablet (3.125 mg total) by mouth 2 (two) times daily with a meal.  . Cholecalciferol (VITAMIN D3) 2000 units TABS Take 1 tablet by mouth daily.  . clonazePAM (KLONOPIN) 0.5 MG tablet Take 0.5 mg by mouth 2 (two) times daily.   . clotrimazole-betamethasone (LOTRISONE) cream Apply 1 application topically 2 (two) times daily as needed (for vaginal discomfort).   . Doxepin HCl (SILENOR) 6 MG TABS Take 3 mg by mouth at bedtime.  Marland Kitchen esomeprazole (NEXIUM) 40 MG capsule Take 40 mg by mouth daily before breakfast.    . estradiol (ESTRACE) 0.1 MG/GM vaginal cream Place 1 Applicatorful vaginally daily as needed (for vaginal discomfort (dryness/itchy)).   Marland Kitchen Glucosamine-Chondroitin (COSAMIN DS PO) Take 1 tablet by mouth 2 (two) times  daily.  . hydrocortisone (ANUSOL-HC) 2.5 % rectal cream Place 1 application rectally 2 (two) times daily as needed for hemorrhoids or itching.   . levETIRAcetam (KEPPRA) 750 MG tablet Take 750 mg by mouth 2 (two) times daily.  Marland Kitchen levocetirizine (XYZAL) 5 MG tablet Take 5 mg by mouth as needed for allergies. Once a day as needed  . memantine (NAMENDA) 5 MG tablet Take 5 mg by mouth daily.  . Misc Natural Products (GLUCOSAMINE CHOND COMPLEX/MSM PO) Take 1 tablet by mouth 2 (two) times daily.  . montelukast (SINGULAIR) 5 MG chewable tablet Chew 1 tablet by mouth daily.  . Multiple Vitamins-Minerals (MULTIVITAMIN ADULT  PO) Take 1 mg by mouth daily.  . Multiple Vitamins-Minerals (PRESERVISION AREDS 2 PO) Take 1 tablet by mouth 2 (two) times daily.  . nitrofurantoin (MACRODANTIN) 50 MG capsule Take 1 capsule by mouth daily.  . Omega-3 Fatty Acids (FISH OIL PO) Take 1 capsule by mouth daily.  Marland Kitchen PARoxetine (PAXIL) 10 MG tablet Take 10 mg by mouth daily at 2 PM.   . pravastatin (PRAVACHOL) 20 MG tablet Take 1 tablet (20 mg total) by mouth daily at 6 PM.  . pregabalin (LYRICA) 50 MG capsule Take 50 mg by mouth 2 (two) times daily.   . Probiotic Product (PROBIOTIC FORMULA) CAPS Take 1 capsule by mouth daily.  . silodosin (RAPAFLO) 4 MG CAPS capsule Take 4 mg by mouth daily with breakfast.   . SPIRIVA HANDIHALER 18 MCG inhalation capsule Take 1 puff by mouth daily.  Marland Kitchen telmisartan (MICARDIS) 40 MG tablet Take 40 mg by mouth daily.       Allergies:   Levaquin [levofloxacin]; Codeine; Penicillins; Promethazine; Sulfa antibiotics; and Sulfasalazine   Social History   Socioeconomic History  . Marital status: Widowed    Spouse name: Not on file  . Number of children: 3  . Years of education: Not on file  . Highest education level: Not on file  Occupational History  . Occupation: retired  Scientific laboratory technician  . Financial resource strain: Not on file  . Food insecurity:    Worry: Not on file    Inability: Not on file  . Transportation needs:    Medical: Not on file    Non-medical: Not on file  Tobacco Use  . Smoking status: Never Smoker  . Smokeless tobacco: Never Used  Substance and Sexual Activity  . Alcohol use: No  . Drug use: No  . Sexual activity: Not on file  Lifestyle  . Physical activity:    Days per week: Not on file    Minutes per session: Not on file  . Stress: Not on file  Relationships  . Social connections:    Talks on phone: Not on file    Gets together: Not on file    Attends religious service: Not on file    Active member of club or organization: Not on file    Attends meetings of  clubs or organizations: Not on file    Relationship status: Not on file  Other Topics Concern  . Not on file  Social History Narrative  . Not on file     Family History: The patient's family history includes Arthritis in her mother; Cancer in her mother and unknown relative; Heart attack in her father and unknown relative; Heart failure in her unknown relative. ROS:   Please see the history of present illness.    All 14 point review of systems negative except as described per history  of present illness  EKGs/Labs/Other Studies Reviewed:      Recent Labs: No results found for requested labs within last 8760 hours.  Recent Lipid Panel No results found for: CHOL, TRIG, HDL, CHOLHDL, VLDL, LDLCALC, LDLDIRECT  Physical Exam:    VS:  BP 122/76 (BP Location: Right Arm, Patient Position: Sitting, Cuff Size: Normal)   Pulse 70   Ht 4\' 11"  (1.499 m)   Wt 139 lb (63 kg)   SpO2 94%   BMI 28.07 kg/m     Wt Readings from Last 3 Encounters:  07/03/18 139 lb (63 kg)  06/11/17 132 lb (59.9 kg)  01/23/17 133 lb (60.3 kg)     GEN:  Well nourished, well developed in no acute distress HEENT: Normal NECK: No JVD; No carotid bruits LYMPHATICS: No lymphadenopathy CARDIAC: RRR, no murmurs, no rubs, no gallops RESPIRATORY:  Clear to auscultation without rales, wheezing or rhonchi  ABDOMEN: Soft, non-tender, non-distended MUSCULOSKELETAL:  No edema; No deformity  SKIN: Warm and dry LOWER EXTREMITIES: no swelling NEUROLOGIC:  Alert and oriented x 3 PSYCHIATRIC:  Normal affect   ASSESSMENT:    1. Takotsubo cardiomyopathy   2. Essential hypertension   3. Risk for falls    PLAN:    In order of problems listed above:  1. Takotsubo cardiomyopathy.  She does have history of disc disease few years ago however a ejection fraction normalized.  Another issue is the fact that she had a cough over the fall and winter she was find to have COPD.  However I will ask you to repeat echocardiogram to  make sure that her symptoms are not related to her heart. 2. Essential hypertension blood pressure well controlled continue present management. 3. Risk of fall we discussed the issue of this problem she need to get up very quickly and sits at the edge of the bed a few minutes before getting up.  She is staying at home with her daughter her daughter actually take her quite frequently to the charge where he works and I told   Medication Adjustments/Labs and Tests Ordered: Current medicines are reviewed at length with the patient today.  Concerns regarding medicines are outlined above.  Orders Placed This Encounter  Procedures  . ECHOCARDIOGRAM COMPLETE   Medication changes: No orders of the defined types were placed in this encounter.   Signed, Park Liter, MD, [facility] 07/03/2018 3:29 PM    [place]

## 2018-07-28 ENCOUNTER — Other Ambulatory Visit: Payer: Self-pay

## 2018-07-28 ENCOUNTER — Ambulatory Visit (INDEPENDENT_AMBULATORY_CARE_PROVIDER_SITE_OTHER): Payer: Medicare Other

## 2018-07-28 DIAGNOSIS — I5181 Takotsubo syndrome: Secondary | ICD-10-CM

## 2018-07-28 NOTE — Progress Notes (Signed)
Complete echocardiogram has been performed.  Jimmy Melana Hingle RDCS, RVT 

## 2018-08-01 ENCOUNTER — Telehealth: Payer: Self-pay | Admitting: Cardiology

## 2018-08-01 NOTE — Telephone Encounter (Signed)
Wants echo results °

## 2018-08-01 NOTE — Telephone Encounter (Signed)
Results were given to the daughter.

## 2018-08-03 ENCOUNTER — Encounter (HOSPITAL_COMMUNITY): Payer: Self-pay | Admitting: Emergency Medicine

## 2018-08-03 ENCOUNTER — Other Ambulatory Visit: Payer: Self-pay

## 2018-08-03 ENCOUNTER — Inpatient Hospital Stay (HOSPITAL_COMMUNITY)
Admission: EM | Admit: 2018-08-03 | Discharge: 2018-08-09 | DRG: 871 | Disposition: A | Discharge status: Skilled Nursing Facility | Payer: Medicare Other | Attending: Internal Medicine | Admitting: Internal Medicine

## 2018-08-03 ENCOUNTER — Emergency Department (HOSPITAL_COMMUNITY): Payer: Medicare Other

## 2018-08-03 DIAGNOSIS — K219 Gastro-esophageal reflux disease without esophagitis: Secondary | ICD-10-CM | POA: Diagnosis present

## 2018-08-03 DIAGNOSIS — Z79899 Other long term (current) drug therapy: Secondary | ICD-10-CM

## 2018-08-03 DIAGNOSIS — F419 Anxiety disorder, unspecified: Secondary | ICD-10-CM | POA: Diagnosis present

## 2018-08-03 DIAGNOSIS — R011 Cardiac murmur, unspecified: Secondary | ICD-10-CM | POA: Diagnosis present

## 2018-08-03 DIAGNOSIS — Z881 Allergy status to other antibiotic agents status: Secondary | ICD-10-CM

## 2018-08-03 DIAGNOSIS — Z7951 Long term (current) use of inhaled steroids: Secondary | ICD-10-CM

## 2018-08-03 DIAGNOSIS — Z888 Allergy status to other drugs, medicaments and biological substances status: Secondary | ICD-10-CM

## 2018-08-03 DIAGNOSIS — J449 Chronic obstructive pulmonary disease, unspecified: Secondary | ICD-10-CM | POA: Diagnosis not present

## 2018-08-03 DIAGNOSIS — Z809 Family history of malignant neoplasm, unspecified: Secondary | ICD-10-CM

## 2018-08-03 DIAGNOSIS — G40909 Epilepsy, unspecified, not intractable, without status epilepticus: Secondary | ICD-10-CM | POA: Diagnosis present

## 2018-08-03 DIAGNOSIS — Z880 Allergy status to penicillin: Secondary | ICD-10-CM

## 2018-08-03 DIAGNOSIS — Z8249 Family history of ischemic heart disease and other diseases of the circulatory system: Secondary | ICD-10-CM

## 2018-08-03 DIAGNOSIS — A419 Sepsis, unspecified organism: Secondary | ICD-10-CM | POA: Diagnosis present

## 2018-08-03 DIAGNOSIS — E785 Hyperlipidemia, unspecified: Secondary | ICD-10-CM | POA: Diagnosis not present

## 2018-08-03 DIAGNOSIS — Z87440 Personal history of urinary (tract) infections: Secondary | ICD-10-CM

## 2018-08-03 DIAGNOSIS — R569 Unspecified convulsions: Secondary | ICD-10-CM

## 2018-08-03 DIAGNOSIS — Z885 Allergy status to narcotic agent status: Secondary | ICD-10-CM

## 2018-08-03 DIAGNOSIS — J181 Lobar pneumonia, unspecified organism: Secondary | ICD-10-CM | POA: Diagnosis present

## 2018-08-03 DIAGNOSIS — J440 Chronic obstructive pulmonary disease with acute lower respiratory infection: Secondary | ICD-10-CM | POA: Diagnosis present

## 2018-08-03 DIAGNOSIS — R251 Tremor, unspecified: Secondary | ICD-10-CM | POA: Diagnosis present

## 2018-08-03 DIAGNOSIS — Z9049 Acquired absence of other specified parts of digestive tract: Secondary | ICD-10-CM

## 2018-08-03 DIAGNOSIS — Z9841 Cataract extraction status, right eye: Secondary | ICD-10-CM

## 2018-08-03 DIAGNOSIS — Z961 Presence of intraocular lens: Secondary | ICD-10-CM | POA: Diagnosis present

## 2018-08-03 DIAGNOSIS — Z9842 Cataract extraction status, left eye: Secondary | ICD-10-CM

## 2018-08-03 DIAGNOSIS — G629 Polyneuropathy, unspecified: Secondary | ICD-10-CM | POA: Diagnosis present

## 2018-08-03 DIAGNOSIS — F32A Depression, unspecified: Secondary | ICD-10-CM | POA: Diagnosis present

## 2018-08-03 DIAGNOSIS — R0902 Hypoxemia: Secondary | ICD-10-CM

## 2018-08-03 DIAGNOSIS — Z853 Personal history of malignant neoplasm of breast: Secondary | ICD-10-CM

## 2018-08-03 DIAGNOSIS — Z882 Allergy status to sulfonamides status: Secondary | ICD-10-CM

## 2018-08-03 DIAGNOSIS — F329 Major depressive disorder, single episode, unspecified: Secondary | ICD-10-CM | POA: Diagnosis present

## 2018-08-03 DIAGNOSIS — F0390 Unspecified dementia without behavioral disturbance: Secondary | ICD-10-CM | POA: Diagnosis present

## 2018-08-03 DIAGNOSIS — I5181 Takotsubo syndrome: Secondary | ICD-10-CM | POA: Diagnosis present

## 2018-08-03 DIAGNOSIS — Z8261 Family history of arthritis: Secondary | ICD-10-CM

## 2018-08-03 DIAGNOSIS — Z90710 Acquired absence of both cervix and uterus: Secondary | ICD-10-CM

## 2018-08-03 DIAGNOSIS — I252 Old myocardial infarction: Secondary | ICD-10-CM

## 2018-08-03 DIAGNOSIS — J189 Pneumonia, unspecified organism: Secondary | ICD-10-CM

## 2018-08-03 DIAGNOSIS — I2510 Atherosclerotic heart disease of native coronary artery without angina pectoris: Secondary | ICD-10-CM | POA: Diagnosis present

## 2018-08-03 DIAGNOSIS — I10 Essential (primary) hypertension: Secondary | ICD-10-CM | POA: Diagnosis present

## 2018-08-03 LAB — URINALYSIS, ROUTINE W REFLEX MICROSCOPIC
Bacteria, UA: NONE SEEN
Bilirubin Urine: NEGATIVE
Glucose, UA: NEGATIVE mg/dL
HGB URINE DIPSTICK: NEGATIVE
Ketones, ur: 20 mg/dL — AB
Leukocytes, UA: NEGATIVE
Nitrite: NEGATIVE
Protein, ur: 30 mg/dL — AB
SPECIFIC GRAVITY, URINE: 1.015 (ref 1.005–1.030)
pH: 7 (ref 5.0–8.0)

## 2018-08-03 LAB — I-STAT CG4 LACTIC ACID, ED
LACTIC ACID, VENOUS: 0.82 mmol/L (ref 0.5–1.9)
Lactic Acid, Venous: 1.14 mmol/L (ref 0.5–1.9)

## 2018-08-03 LAB — CBC
HCT: 47 % — ABNORMAL HIGH (ref 36.0–46.0)
Hemoglobin: 15.7 g/dL — ABNORMAL HIGH (ref 12.0–15.0)
MCH: 30.3 pg (ref 26.0–34.0)
MCHC: 33.4 g/dL (ref 30.0–36.0)
MCV: 90.6 fL (ref 78.0–100.0)
PLATELETS: 171 10*3/uL (ref 150–400)
RBC: 5.19 MIL/uL — ABNORMAL HIGH (ref 3.87–5.11)
RDW: 11.9 % (ref 11.5–15.5)
WBC: 13.9 10*3/uL — ABNORMAL HIGH (ref 4.0–10.5)

## 2018-08-03 LAB — PROCALCITONIN: PROCALCITONIN: 0.62 ng/mL

## 2018-08-03 LAB — INFLUENZA PANEL BY PCR (TYPE A & B)
INFLAPCR: NEGATIVE
INFLBPCR: NEGATIVE

## 2018-08-03 LAB — BASIC METABOLIC PANEL
Anion gap: 10 (ref 5–15)
BUN: 11 mg/dL (ref 8–23)
CHLORIDE: 103 mmol/L (ref 98–111)
CO2: 24 mmol/L (ref 22–32)
CREATININE: 0.96 mg/dL (ref 0.44–1.00)
Calcium: 9.6 mg/dL (ref 8.9–10.3)
GFR calc Af Amer: 59 mL/min — ABNORMAL LOW (ref >60)
GFR calc non Af Amer: 51 mL/min — ABNORMAL LOW (ref >60)
Glucose, Bld: 110 mg/dL — ABNORMAL HIGH (ref 70–99)
Potassium: 4.1 mmol/L (ref 3.5–5.1)
Sodium: 137 mmol/L (ref 135–145)

## 2018-08-03 LAB — CBG MONITORING, ED: GLUCOSE-CAPILLARY: 110 mg/dL — AB (ref 70–99)

## 2018-08-03 LAB — BRAIN NATRIURETIC PEPTIDE: B NATRIURETIC PEPTIDE 5: 193.3 pg/mL — AB (ref 0.0–100.0)

## 2018-08-03 LAB — LACTIC ACID, PLASMA: Lactic Acid, Venous: 1.6 mmol/L (ref 0.5–1.9)

## 2018-08-03 MED ORDER — CARBOXYMETHYLCELLULOSE SOD PF 0.5 % OP SOLN: 1.0000 [drp] | Freq: Four times a day (QID) | OPHTHALMIC | Status: DC | PRN

## 2018-08-03 MED ORDER — GLUCOSAMINE-CHONDROITIN 250-200 MG PO TABS: ORAL_TABLET | Freq: Two times a day (BID) | ORAL | Status: DC

## 2018-08-03 MED ORDER — ACETAMINOPHEN 325 MG PO TABS
650.0000 mg | ORAL_TABLET | Freq: Once | ORAL | Status: AC
  Administered 2018-08-03: 650 mg via ORAL
  Filled 2018-08-03: qty 2

## 2018-08-03 MED ORDER — SODIUM CHLORIDE 0.9 % IV SOLN
1.0000 g | INTRAVENOUS | Status: DC
  Administered 2018-08-03 – 2018-08-05 (×3): 1 g via INTRAVENOUS
  Filled 2018-08-03 (×3): qty 10

## 2018-08-03 MED ORDER — PANTOPRAZOLE SODIUM 40 MG PO TBEC
40.0000 mg | DELAYED_RELEASE_TABLET | Freq: Every day | ORAL | Status: DC
  Administered 2018-08-04 – 2018-08-09 (×6): 40 mg via ORAL
  Filled 2018-08-03 (×6): qty 1

## 2018-08-03 MED ORDER — LORAZEPAM 2 MG/ML IJ SOLN: 1.0000 mg | INTRAMUSCULAR | Status: DC | PRN

## 2018-08-03 MED ORDER — LEVETIRACETAM 750 MG PO TABS
750.0000 mg | ORAL_TABLET | Freq: Two times a day (BID) | ORAL | Status: DC
  Administered 2018-08-03 – 2018-08-09 (×12): 750 mg via ORAL
  Filled 2018-08-03 (×13): qty 1

## 2018-08-03 MED ORDER — TIOTROPIUM BROMIDE MONOHYDRATE 18 MCG IN CAPS
18.0000 ug | ORAL_CAPSULE | Freq: Every day | RESPIRATORY_TRACT | Status: DC
  Filled 2018-08-03: qty 5

## 2018-08-03 MED ORDER — SODIUM CHLORIDE 0.9 % IV SOLN
500.0000 mg | INTRAVENOUS | Status: DC
  Administered 2018-08-03 – 2018-08-05 (×3): 500 mg via INTRAVENOUS
  Filled 2018-08-03 (×3): qty 500

## 2018-08-03 MED ORDER — CLONAZEPAM 0.5 MG PO TABS
0.5000 mg | ORAL_TABLET | Freq: Two times a day (BID) | ORAL | Status: DC
  Administered 2018-08-04 – 2018-08-09 (×11): 0.5 mg via ORAL
  Filled 2018-08-03 (×11): qty 1

## 2018-08-03 MED ORDER — HYDRALAZINE HCL 20 MG/ML IJ SOLN: 5.0000 mg | INTRAMUSCULAR | Status: DC | PRN

## 2018-08-03 MED ORDER — MONTELUKAST SODIUM 5 MG PO CHEW
5.0000 mg | CHEWABLE_TABLET | Freq: Every day | ORAL | Status: DC
  Administered 2018-08-04 – 2018-08-08 (×5): 5 mg via ORAL
  Filled 2018-08-03 (×6): qty 1

## 2018-08-03 MED ORDER — IPRATROPIUM-ALBUTEROL 0.5-2.5 (3) MG/3ML IN SOLN
3.0000 mL | RESPIRATORY_TRACT | Status: DC
  Administered 2018-08-03: 3 mL via RESPIRATORY_TRACT
  Filled 2018-08-03: qty 3

## 2018-08-03 MED ORDER — ZOLPIDEM TARTRATE 5 MG PO TABS: 5.0000 mg | ORAL_TABLET | Freq: Every evening | ORAL | Status: DC | PRN

## 2018-08-03 MED ORDER — HYDROCORTISONE 2.5 % RE CREA
1.0000 "application " | TOPICAL_CREAM | Freq: Two times a day (BID) | RECTAL | Status: DC | PRN
  Filled 2018-08-03: qty 28.35

## 2018-08-03 MED ORDER — ALBUTEROL SULFATE (2.5 MG/3ML) 0.083% IN NEBU: 2.5000 mg | INHALATION_SOLUTION | RESPIRATORY_TRACT | Status: DC | PRN

## 2018-08-03 MED ORDER — CARVEDILOL 3.125 MG PO TABS
3.1250 mg | ORAL_TABLET | Freq: Two times a day (BID) | ORAL | Status: DC
  Administered 2018-08-03 – 2018-08-06 (×6): 3.125 mg via ORAL
  Filled 2018-08-03 (×6): qty 1

## 2018-08-03 MED ORDER — DONEPEZIL HCL 5 MG PO TABS
5.0000 mg | ORAL_TABLET | Freq: Every day | ORAL | Status: DC
  Administered 2018-08-04 – 2018-08-06 (×3): 5 mg via ORAL
  Filled 2018-08-03 (×3): qty 1

## 2018-08-03 MED ORDER — ENOXAPARIN SODIUM 40 MG/0.4ML ~~LOC~~ SOLN
40.0000 mg | SUBCUTANEOUS | Status: DC
  Administered 2018-08-03 – 2018-08-08 (×6): 40 mg via SUBCUTANEOUS
  Filled 2018-08-03 (×6): qty 0.4

## 2018-08-03 MED ORDER — PAROXETINE HCL 10 MG PO TABS
10.0000 mg | ORAL_TABLET | Freq: Every day | ORAL | Status: DC
  Administered 2018-08-03 – 2018-08-06 (×4): 10 mg via ORAL
  Filled 2018-08-03 (×5): qty 1

## 2018-08-03 MED ORDER — METRONIDAZOLE IN NACL 5-0.79 MG/ML-% IV SOLN
500.0000 mg | Freq: Three times a day (TID) | INTRAVENOUS | Status: DC
  Administered 2018-08-03: 500 mg via INTRAVENOUS
  Filled 2018-08-03: qty 100

## 2018-08-03 MED ORDER — VITAMIN D3 50 MCG (2000 UT) PO TABS: 2000.0000 [IU] | ORAL_TABLET | Freq: Every day | ORAL | Status: DC

## 2018-08-03 MED ORDER — POLYVINYL ALCOHOL 1.4 % OP SOLN
1.0000 [drp] | OPHTHALMIC | Status: DC | PRN
  Administered 2018-08-08: 1 [drp] via OPHTHALMIC
  Filled 2018-08-03: qty 15

## 2018-08-03 MED ORDER — ESTRADIOL 0.1 MG/GM VA CREA
1.0000 | TOPICAL_CREAM | Freq: Every evening | VAGINAL | Status: DC | PRN
  Filled 2018-08-03: qty 42.5

## 2018-08-03 MED ORDER — SODIUM CHLORIDE 0.9 % IV SOLN
2.0000 g | Freq: Once | INTRAVENOUS | Status: AC
  Administered 2018-08-03: 2 g via INTRAVENOUS
  Filled 2018-08-03: qty 2

## 2018-08-03 MED ORDER — LORATADINE 10 MG PO TABS
10.0000 mg | ORAL_TABLET | Freq: Every day | ORAL | Status: DC | PRN
  Administered 2018-08-09: 10 mg via ORAL
  Filled 2018-08-03: qty 1

## 2018-08-03 MED ORDER — PRAVASTATIN SODIUM 20 MG PO TABS
20.0000 mg | ORAL_TABLET | Freq: Every day | ORAL | Status: DC
  Administered 2018-08-04 – 2018-08-08 (×5): 20 mg via ORAL
  Filled 2018-08-03 (×7): qty 1

## 2018-08-03 MED ORDER — ACETAMINOPHEN 325 MG PO TABS
650.0000 mg | ORAL_TABLET | Freq: Four times a day (QID) | ORAL | Status: DC | PRN
  Administered 2018-08-04 – 2018-08-09 (×2): 650 mg via ORAL
  Filled 2018-08-03 (×2): qty 2

## 2018-08-03 MED ORDER — DOXEPIN HCL 3 MG PO TABS
1.5000 mg | ORAL_TABLET | Freq: Every day | ORAL | Status: DC
  Filled 2018-08-03 (×3): qty 1

## 2018-08-03 MED ORDER — CLONAZEPAM 0.5 MG PO TABS
0.5000 mg | ORAL_TABLET | Freq: Once | ORAL | Status: AC
  Administered 2018-08-03: 0.5 mg via ORAL
  Filled 2018-08-03: qty 1

## 2018-08-03 MED ORDER — SODIUM CHLORIDE 0.9 % IV SOLN
INTRAVENOUS | Status: DC
  Administered 2018-08-03: 22:00:00 via INTRAVENOUS

## 2018-08-03 MED ORDER — ONDANSETRON HCL 4 MG/2ML IJ SOLN: 4.0000 mg | Freq: Three times a day (TID) | INTRAMUSCULAR | Status: DC | PRN

## 2018-08-03 MED ORDER — DM-GUAIFENESIN ER 30-600 MG PO TB12: 1.0000 | ORAL_TABLET | Freq: Two times a day (BID) | ORAL | Status: DC | PRN

## 2018-08-03 MED ORDER — OMEGA-3-ACID ETHYL ESTERS 1 G PO CAPS
1.0000 g | ORAL_CAPSULE | Freq: Every day | ORAL | Status: DC
  Administered 2018-08-04 – 2018-08-09 (×6): 1 g via ORAL
  Filled 2018-08-03 (×6): qty 1

## 2018-08-03 MED ORDER — ADULT MULTIVITAMIN W/MINERALS CH
1.0000 | ORAL_TABLET | Freq: Every day | ORAL | Status: DC
  Administered 2018-08-04 – 2018-08-08 (×5): 1 via ORAL
  Filled 2018-08-03 (×5): qty 1

## 2018-08-03 MED ORDER — VANCOMYCIN HCL IN DEXTROSE 1-5 GM/200ML-% IV SOLN
1000.0000 mg | Freq: Once | INTRAVENOUS | Status: AC
  Administered 2018-08-03: 1000 mg via INTRAVENOUS
  Filled 2018-08-03: qty 200

## 2018-08-03 MED ORDER — VITAMIN D 1000 UNITS PO TABS
2000.0000 [IU] | ORAL_TABLET | Freq: Every day | ORAL | Status: DC
  Administered 2018-08-04 – 2018-08-09 (×6): 2000 [IU] via ORAL
  Filled 2018-08-03 (×6): qty 2

## 2018-08-03 MED ORDER — BETHANECHOL CHLORIDE 10 MG PO TABS
10.0000 mg | ORAL_TABLET | Freq: Every day | ORAL | Status: DC
  Administered 2018-08-04 – 2018-08-09 (×6): 10 mg via ORAL
  Filled 2018-08-03 (×6): qty 1

## 2018-08-03 MED ORDER — MOMETASONE FURO-FORMOTEROL FUM 100-5 MCG/ACT IN AERO
2.0000 | INHALATION_SPRAY | Freq: Two times a day (BID) | RESPIRATORY_TRACT | Status: DC
  Administered 2018-08-04 – 2018-08-09 (×11): 2 via RESPIRATORY_TRACT
  Filled 2018-08-03: qty 8.8

## 2018-08-03 MED ORDER — CARBOXYMETHYLCELLULOSE SODIUM 1 % OP GEL: 1.0000 "application " | OPHTHALMIC | Status: DC

## 2018-08-03 MED ORDER — RISAQUAD PO CAPS
1.0000 | ORAL_CAPSULE | Freq: Every day | ORAL | Status: DC
  Administered 2018-08-04 – 2018-08-09 (×7): 1 via ORAL
  Filled 2018-08-03 (×13): qty 1

## 2018-08-03 MED ORDER — MEMANTINE HCL 5 MG PO TABS
5.0000 mg | ORAL_TABLET | Freq: Every day | ORAL | Status: DC
  Administered 2018-08-04 – 2018-08-06 (×3): 5 mg via ORAL
  Filled 2018-08-03 (×7): qty 1

## 2018-08-03 MED ORDER — LEVOCETIRIZINE DIHYDROCHLORIDE 5 MG PO TABS: 5.0000 mg | ORAL_TABLET | Freq: Every day | ORAL | Status: DC

## 2018-08-03 MED ORDER — PREGABALIN 50 MG PO CAPS
50.0000 mg | ORAL_CAPSULE | Freq: Two times a day (BID) | ORAL | Status: DC
  Administered 2018-08-04 – 2018-08-09 (×11): 50 mg via ORAL
  Filled 2018-08-03 (×11): qty 1

## 2018-08-03 MED ORDER — SODIUM CHLORIDE 0.9 % IV BOLUS
1000.0000 mL | Freq: Once | INTRAVENOUS | Status: AC
  Administered 2018-08-03: 1000 mL via INTRAVENOUS

## 2018-08-03 NOTE — ED Triage Notes (Signed)
Daughter stated,  2 days ago she started having SOB and having tremors. Usually can walk and has not been able , we have used a wheelchair

## 2018-08-03 NOTE — Progress Notes (Signed)
  Pharmacy Antibiotic Note  [name] is a 84 y.o. female admitted on 08/03/2018 with sepsis.  Pharmacy has been consulted for vancomycin and cefepime dosing. Pt with tmax 102.7 and WBC is elevated at 13.9. SCr is WNL. Lactic acid is WNL.   Plan: Cefepime 2gm IV x 1 then 1gm IV Q24H Vancomycin 1gm IV Q24H F/u renal fxn, C&S, clinical status and trough at SS  Height: 4\' 11"  (149.9 cm) Weight: 134 lb (60.8 kg) IBW/kg (Calculated) : 43.2  Temp (24hrs), Avg:102.5 F (39.2 C), Min:102.3 F (39.1 C), Max:102.7 F (39.3 C)  Recent Labs  Lab 08/03/18 1334 08/03/18 1347  WBC 13.9*  --   CREATININE 0.96  --   LATICACIDVEN  --  1.14    Estimated Creatinine Clearance: 32.1 mL/min (by C-G formula based on SCr of 0.96 mg/dL).    Allergies  Allergen Reactions  . Levaquin [Levofloxacin] Other (See Comments)    MAY HAVE PRECIPITATED SEIZURE  . Codeine Anxiety    NERVOUS  . Penicillins Rash    Has patient had a PCN reaction causing immediate rash, facial/tongue/throat swelling, SOB or lightheadedness with hypotension:unsure Has patient had a PCN reaction causing severe rash involving mucus membranes or skin necrosis:unsure Has patient had a PCN reaction that required hospitalization:unsure Has patient had a PCN reaction occurring within the last 10 years:No If all of the above answers are "NO", then may proceed with Cephalosporin use.   . Promethazine Nausea And Vomiting    "I can take pills, not IV"  . Sulfa Antibiotics Itching and Rash  . Sulfasalazine Itching and Rash    Antimicrobials this admission: Vanc 9/22>> Cefepime 9/22>>  Dose adjustments this admission: N/A  Microbiology results: Pending  Thank you for allowing pharmacy to be a part of this patient's care.  Heberto Sturdevant, Rande Lawman 08/03/2018 3:48 PM

## 2018-08-03 NOTE — ED Notes (Signed)
Patient transported to CT 

## 2018-08-03 NOTE — ED Notes (Signed)
Pt still has not voided since episode of incontinence in lobby. Purewick verified in place.

## 2018-08-03 NOTE — ED Notes (Signed)
Pt transported to imaging.

## 2018-08-03 NOTE — ED Provider Notes (Signed)
[facility] EMERGENCY DEPARTMENT Provider Note   CSN: [id number] Arrival date & time: 08/03/18  1307     History   Chief Complaint Chief Complaint  Patient presents with  . Weakness  . Tremors    HPI [name] is a 84 y.o. female.  Patient is a 84 year old female with a history of hypertension, recently diagnosed COPD and prior seizures who presents with generalized weakness.  Her daughter states she has had a decline over the last 2 to 3 days.  She lives at home with her daughter.  She has had some progressive weakness.  She has previously been ambulatory at baseline but today has not been able to get out of bed.  She was increasingly weak yesterday as well.  She has had complaints of some shortness of breath.  She has a cough at baseline which they say is unchanged.  She has had no known fevers at home but was noted to have a temperature of 102 here in the ED today.  She has not had any difficulty with urination.  She had one episode of vomiting this morning but no diarrhea.  No complaints of abdominal pain.  She had a little bit of increased confusion yesterday but seemed more at baseline today.  Her daughter states she is also had some worsening right arm tremor and some tremor in her jaw which is been worse over the last few months but since she has been sick they have noted it being more prominent and she has had a tremor in both of her arms.     Past Medical History:  Diagnosis Date  . Anemia    "when she was a child"  . Anxiety   . Cough    "chronic cough"  . GERD (gastroesophageal reflux disease)   . Heart murmur    in the past not sure if recent  . History of acute bronchitis   . HTN (hypertension)   . Hx of bladder infections   . Myocardial infarction [facility])    "heart attack from Takotsubo no damage according to cath" in 2010  . Peripheral neuropathy   . Seizure disorder ([place])   . Seizures ([place])    last seizure 2010  . Takotsubo  cardiomyopathy     Patient Active Problem List   Diagnosis Date Noted  . Closed fracture of distal end of right radius 06/02/2018  . Viral upper respiratory infection 02/07/2018  . Wheezing 12/31/2017  . Breast cancer ([place]) 11/25/2017  . CVA (cerebral vascular accident) ([place]) 11/25/2017  . GERD (gastroesophageal reflux disease) 11/25/2017  . Chronic obstructive pulmonary disease ([place]) 10/29/2017  . Ingrown nail of great toe of right foot 09/11/2017  . Paronychia of great toe, right 09/11/2017  . Dyslipidemia 06/11/2017  . Bleeding, intracranial ([place]) 06/11/2017  . Mastitis 03/02/2017  . MSSA (methicillin susceptible Staphylococcus aureus) infection 03/02/2017  . Open wound of right breast 03/02/2017  . Febrile illness, acute 02/25/2017  . Fall 02/22/2017  . 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 Leucocytosis 02/22/2017  . Physical debility 02/22/2017  . Anxiety 06/20/2016  . Glossodynia 06/20/2016  . Insomnia, unspecified 06/20/2016  . Partial symptomatic epilepsy with complex partial seizures, not intractable, without status epilepticus ([place]) 06/20/2016  . Risk for falls 06/20/2016  . Takotsubo cardiomyopathy   . HTN (hypertension)     Past Surgical History:  Procedure Laterality Date  . ABDOMINAL HYSTERECTOMY    . BREAST LUMPECTOMY Left  benign  . BREAST LUMPECTOMY WITH RADIOACTIVE SEED LOCALIZATION Right 01/23/2017   Procedure: RADIOACTIVE SEED GUIDED RIGHT BREAST LUMPECTOMY;  Surgeon: Rolm Bookbinder, MD;  Location: [facility];  Service: General;  Laterality: Right;  . CARDIAC CATHETERIZATION  2010   No significant CAD. stress induced cardiomyopathy.   . CARPAL TUNNEL RELEASE Bilateral    2014  . CATARACT EXTRACTION W/ INTRAOCULAR LENS IMPLANT Bilateral   . CHOLECYSTECTOMY    . LAPAROSCOPIC ABDOMINAL EXPLORATION    . TONSILLECTOMY       OB History   None      Home Medications    Prior to Admission medications   Medication Sig Start Date  End Date Taking? Authorizing Provider  acetaminophen (TYLENOL) 500 MG tablet Take 1,000 mg by mouth every 6 (six) hours as needed (for pain/headaches.).    Yes [provider]  albuterol (PROVENTIL) (2.5 MG/3ML) 0.083% nebulizer solution Take 2.5 mg by nebulization every 6 (six) hours as needed for wheezing.  05/12/18  Yes [provider]  bethanechol (URECHOLINE) 10 MG tablet Take 10 mg by mouth daily with breakfast.    Yes [provider]  budesonide-formoterol (SYMBICORT) 80-4.5 MCG/ACT inhaler Inhale 2 puffs into the lungs daily.  12/31/17  Yes [provider]  Carboxymethylcellulose Sod PF (REFRESH PLUS) 0.5 % SOLN Place 1 drop into both eyes 4 (four) times daily as needed (dry eyes).  02/25/17  Yes [provider]  Carboxymethylcellulose Sodium (REFRESH LIQUIGEL) 1 % GEL Place 1 application into both eyes See admin instructions. Instill under eyelid of both eyes every other night   Yes [provider]  carvedilol (COREG) 3.125 MG tablet Take 1 tablet (3.125 mg total) by mouth 2 (two) times daily with a meal. 07/03/18  Yes Park Liter, MD  Cholecalciferol (VITAMIN D3) 2000 units TABS Take 2,000 Units by mouth daily with breakfast.    Yes [provider]  clonazePAM (KLONOPIN) 0.5 MG tablet Take 0.5 mg by mouth 2 (two) times daily with a meal. To prevent breakthrough seizures   Yes [provider]  clotrimazole-betamethasone (LOTRISONE) cream Apply 1 application topically 2 (two) times daily as needed (for vaginal discomfort).    Yes [provider]  dextromethorphan-guaiFENesin (MUCINEX DM) 30-600 MG 12hr tablet Take 1 tablet by mouth at bedtime as needed for cough.   Yes [provider]  donepezil (ARICEPT) 5 MG tablet Take 5 mg by mouth daily with supper.  03/29/17 11/02/18 Yes [provider]  Doxepin HCl (SILENOR) 3 MG TABS Take 1.5 mg by mouth at bedtime.    Yes [provider]    esomeprazole (NEXIUM) 40 MG capsule Take 40 mg by mouth daily with breakfast.    Yes [provider]  estradiol (ESTRACE) 0.1 MG/GM vaginal cream Place 1 Applicatorful vaginally at bedtime as needed (for vaginal discomfort (dryness/itchy)).    Yes [provider]  GLUCOSAMINE-CHONDROITIN PO Take 1 tablet by mouth 2 (two) times daily with a meal.   Yes [provider]  guaifenesin (ROBITUSSIN) 100 MG/5ML syrup Take 200 mg by mouth 3 (three) times daily as needed for cough.   Yes [provider]  hydrocortisone (ANUSOL-HC) 2.5 % rectal cream Place 1 application rectally 2 (two) times daily as needed for hemorrhoids or itching.    Yes [provider]  levETIRAcetam (KEPPRA) 750 MG tablet Take 750 mg by mouth 2 (two) times daily with a meal. For seizures   Yes [provider]  levocetirizine Harlow [facility]ms)  5 MG tablet Take 5 mg by mouth daily with breakfast. Once a day as needed    Yes [provider]  memantine (NAMENDA) 5 MG tablet Take 5 mg by mouth daily with supper.  03/29/17  Yes [provider]  montelukast (SINGULAIR) 5 MG chewable tablet Chew 5 mg by mouth daily with supper.  06/30/18  Yes [provider]  Multiple Vitamin (MULTIVITAMIN WITH MINERALS) TABS tablet Take 1 tablet by mouth daily with supper.   Yes [provider]  Multiple Vitamins-Minerals (PRESERVISION AREDS 2 PO) Take 1 capsule by mouth 2 (two) times daily with a meal.    Yes [provider]  nitrofurantoin (MACRODANTIN) 50 MG capsule Take 50 mg by mouth daily with supper.  07/02/17  Yes [provider]  Omega-3 Fatty Acids (FISH OIL) 1000 MG CAPS Take 1,000 mg by mouth 2 (two) times daily with a meal.   Yes [provider]  PARoxetine (PAXIL) 10 MG tablet Take 10 mg by mouth daily at 2 PM.    Yes [provider]  pravastatin (PRAVACHOL) 20 MG tablet Take 1 tablet (20 mg total) by mouth daily at 6 PM. Patient  taking differently: Take 20 mg by mouth daily with supper.  07/03/18  Yes Park Liter, MD  pregabalin (LYRICA) 50 MG capsule Take 50 mg by mouth 2 (two) times daily with a meal.    Yes [provider]  Probiotic Product (PROBIOTIC FORMULA) CAPS Take 1 capsule by mouth daily with breakfast.    Yes [provider]  silodosin (RAPAFLO) 4 MG CAPS capsule Take 4 mg by mouth daily with breakfast.    Yes [provider]  telmisartan (MICARDIS) 40 MG tablet Take 40 mg by mouth daily with breakfast.    Yes [provider]  tiotropium (SPIRIVA) 18 MCG inhalation capsule Place 18 mcg into inhaler and inhale at bedtime.   Yes [provider]    Family History Family History  Problem Relation Age of Onset  . Heart failure Unknown   . Heart attack Unknown   . Cancer Unknown   . Arthritis Mother   . Cancer Mother   . Heart attack Father     Social History Social History   Tobacco Use  . Smoking status: Never Smoker  . Smokeless tobacco: Never Used  Substance Use Topics  . Alcohol use: No  . Drug use: No     Allergies   Levaquin [levofloxacin]; Codeine; Penicillins; Promethazine; Sulfa antibiotics; and Sulfasalazine   Review of Systems Review of Systems  Constitutional: Positive for fatigue and fever. Negative for chills and diaphoresis.  HENT: Negative for congestion, rhinorrhea and sneezing.   Eyes: Negative.   Respiratory: Positive for cough and shortness of breath. Negative for chest tightness.   Cardiovascular: Negative for chest pain and leg swelling.  Gastrointestinal: Positive for vomiting. Negative for abdominal pain, blood in stool, diarrhea and nausea.  Genitourinary: Negative for difficulty urinating, flank pain, frequency and hematuria.  Musculoskeletal: Negative for arthralgias and back pain.  Skin: Negative for rash.  Neurological: Positive for tremors. Negative for dizziness, speech difficulty, weakness, numbness and  headaches.     Physical Exam Updated Vital Signs BP 108/61   Pulse 70   Temp (!) 102.3 F (39.1 C) (Rectal)   Resp 18   Ht 4\' 11"  (1.499 m)   Wt 60.8 kg   SpO2 96%   BMI 27.06 kg/m   Physical Exam  Constitutional: She appears well-developed  and well-nourished.  HENT:  Head: Normocephalic and atraumatic.  Eyes: Pupils are equal, round, and reactive to light.  Neck: Normal range of motion. Neck supple.  Cardiovascular: Normal rate, regular rhythm and normal heart sounds.  Pulmonary/Chest: Effort normal. No respiratory distress. She has wheezes. She has no rales. She exhibits no tenderness.  Oxygen saturations are 87 to 88% on room air patient is not on oxygen at home.  Abdominal: Soft. Bowel sounds are normal. There is no tenderness. There is no rebound and no guarding.  Musculoskeletal: Normal range of motion. She exhibits no edema.  Lymphadenopathy:    She has no cervical adenopathy.  Neurological: She is alert.  Alert and oriented x2.  She is moving all extremities symmetrically without focal deficits.  Skin: Skin is warm and dry. No rash noted.  Psychiatric: She has a normal mood and affect.     ED Treatments / Results  Labs (all labs ordered are listed, but only abnormal results are displayed) Labs Reviewed  BASIC METABOLIC PANEL - Abnormal; Notable for the following components:      Result Value   Glucose, Bld 110 (*)    GFR calc non Af Amer 51 (*)    GFR calc Af Amer 59 (*)    All other components within normal limits  CBC - Abnormal; Notable for the following components:   WBC 13.9 (*)    RBC 5.19 (*)    Hemoglobin 15.7 (*)    HCT 47.0 (*)    All other components within normal limits  URINALYSIS, ROUTINE W REFLEX MICROSCOPIC - Abnormal; Notable for the following components:   Ketones, ur 20 (*)    Protein, ur 30 (*)    All other components within normal limits  CBG MONITORING, ED - Abnormal; Notable for the following components:   Glucose-Capillary 110  (*)    All other components within normal limits  CULTURE, BLOOD (ROUTINE X 2)  CULTURE, BLOOD (ROUTINE X 2)  INFLUENZA PANEL BY PCR (TYPE A & B)  I-STAT CG4 LACTIC ACID, ED  I-STAT CG4 LACTIC ACID, ED    EKG EKG Interpretation  Date/Time:  Sunday August 03 2018 13:32:34 EDT Ventricular Rate:  108 PR Interval:  148 QRS Duration: 60 QT Interval:  354 QTC Calculation: 474 R Axis:   78 Text Interpretation:  Sinus tachycardia with occasional Premature ventricular complexes Low voltage QRS Septal infarct , age undetermined Abnormal ECG No old tracing to compare Confirmed by Malvin Johns [phone]) on 08/03/2018 3:04:33 PM   Radiology Dg Chest 2 View  Result Date: 08/03/2018 CLINICAL DATA:  Fever with weakness and shortness of breath. EXAM: CHEST - 2 VIEW COMPARISON:  03/28/2018 and 12/31/2017 radiographs. FINDINGS: The heart size and mediastinal contours are stable allowing for positional differences. There is aortic atherosclerosis. There is new left perihilar opacity on the frontal examination, likely representing pneumonia in this clinical context. The right lung is clear. There is no pleural effusion or pneumothorax. Vertebral body anomaly and focal kyphosis near the thoracolumbar junction are again noted. IMPRESSION: New left perihilar opacity most consistent with pneumonia in this clinical context. Followup PA and lateral chest X-ray is recommended in 3-4 weeks following trial of antibiotic therapy to ensure resolution and exclude underlying malignancy. Electronically Signed   By: Richardean Sale M.D.   On: 08/03/2018 16:01   Ct Head Wo Contrast  Result Date: 08/03/2018 CLINICAL DATA:  Uncontrolled tremors over the last 2 days. Decreased responsiveness. History of hypertension and seizures. EXAM:  CT HEAD WITHOUT CONTRAST TECHNIQUE: Contiguous axial images were obtained from the base of the skull through the vertex without intravenous contrast. COMPARISON:  CT head 03/09/2017 and  02/27/2017. FINDINGS: Brain: There is no evidence of acute intracranial hemorrhage, mass lesion, brain edema or extra-axial fluid collection. There is stable mild atrophy with mild ventricular dilatation. There is extensive confluent low-density in the periventricular and subcortical white matter, similar to previous study. There is no CT evidence of acute cortical infarction. Vascular: Intracranial vascular calcifications. No hyperdense vessel identified. Skull: The calvarium appears demineralized, especially the clivus. No focal marrow lesion or acute osseous findings. Sinuses/Orbits: Stable chronic partial opacification of the left sphenoid sinus and right mastoid air cells. No air-fluid levels. Previous bilateral lens surgery. Other: None. IMPRESSION: 1. No acute intracranial findings demonstrated. 2. Stable extensive periventricular white matter disease, calvarial demineralization and chronic sinus disease. Electronically Signed   By: Richardean Sale M.D.   On: 08/03/2018 17:32    Procedures Procedures (including critical care time)  Medications Ordered in ED Medications  metroNIDAZOLE (FLAGYL) IVPB 500 mg (0 mg Intravenous Stopped 08/03/18 1744)  acetaminophen (TYLENOL) tablet 650 mg (650 mg Oral Given 08/03/18 1334)  ceFEPIme (MAXIPIME) 2 g in sodium chloride 0.9 % 100 mL IVPB (0 g Intravenous Stopped 08/03/18 1649)  vancomycin (VANCOCIN) IVPB 1000 mg/200 mL premix (0 mg Intravenous Stopped 08/03/18 1856)     Initial Impression / Assessment and Plan / ED Course  I have reviewed the triage vital signs and the nursing notes.  Pertinent labs & imaging results that were available during my care of the patient were reviewed by me and considered in my medical decision making (see chart for details).     Patient is a 84 year old female who presents with generalized weakness, shortness of breath and fever.  Chest x-ray shows evidence of perihilar pneumonia.  Her blood pressures been soft.  Her white  count is markedly elevated.  Her lactate is normal.  She was treated for sepsis secondary to pneumonia.  She was given IV antibiotics and IV fluids.  At this point she has not required pressors to maintain her blood pressure.  Given her right arm tremor that is been going on for a while, head CT was performed which showed no evidence of acute abnormality.  Her other labs are non-concerning.  She is requiring oxygen at 2 L/min.  I have consulted unassigned medicine for admission.  Her PCP is in [place].  I spoke with Dr. Blaine Hamper who will admit the pt.  CRITICAL CARE Performed by: Malvin Johns Total critical care time: 60 minutes Critical care time was exclusive of separately billable procedures and treating other patients. Critical care was necessary to treat or prevent imminent or life-threatening deterioration. Critical care was time spent personally by me on the following activities: development of treatment plan with patient and/or surrogate as well as nursing, discussions with consultants, evaluation of patient's response to treatment, examination of patient, obtaining history from patient or surrogate, ordering and performing treatments and interventions, ordering and review of laboratory studies, ordering and review of radiographic studies, pulse oximetry and re-evaluation of patient's condition.   Final Clinical Impressions(s) / ED Diagnoses   Final diagnoses:  Community acquired pneumonia of left lung, unspecified part of lung  Hypoxia    ED Discharge Orders    None       Malvin Johns, MD 08/03/18 [phone]

## 2018-08-03 NOTE — ED Notes (Signed)
Pt has returned from imaging 

## 2018-08-03 NOTE — H&P (Signed)
History and Physical    [name] XBJ:[id number] DOB: 10/02/1927 DOA: 08/03/2018  Referring MD/NP/PA:   PCP: Myrlene Broker, MD   Patient coming from:  The patient is coming from home.  At baseline, pt is partially dependent for most of ADL.   Chief Complaint: Cough, shortness of breath, fever, chills, generalized weakness,  HPI: [name] is a 84 y.o. female with medical history significant of dementia, hypertension, hyperlipidemia, COPD, anxiety, CAD, GERD, Takotsubo cardiomyopathy, seizure, ICH, breast cancer (right lumpectomy, no radiation and chemotherapy), IBS, who presents with cough, shortness of breath, generalized weakness, fever and chills.  Per patient's daughter, patient has been having shortness of breath, cough in the past 2 days.  She coughs up greenish colored sputum.  Patient also has fever and chills.  Denies chest pain.  She has generalized weakness, particularly in both legs.  Patient normally can walk, but is not able to stand up to walk normally due to weakness.  Patient does not have facial droop, slurred speech, vision loss or hearing loss.  Patient has nausea and vomited once when she had severe coughing per her daughter.  Patient has chronic IBS, and chronic intermittent mild diarrhea, which has not changed recently.  No abdominal pain, no symptoms for UTI.  ED Course: pt was found to have WBC 13.9, lactic acid 0.82, negative urinalysis, electrolytes renal function okay, temperature 102.7, no tachycardia, oxygen saturation 87% on room air, CT head is negative for acute intracranial abnormalities.  Chest x-ray showed left perihilar infiltration.  Blood pressures disorder.  Patient is placed on telemetry bed of observation.  Review of Systems:   General: Has fevers, chills, no body weight gain, has fatigue HEENT: no blurry vision, hearing changes or sore throat Respiratory: Has dyspnea, coughing, no wheezing CV: no chest pain, no palpitations GI:  Has nausea, vomiting, diarrhea, no constipation, abdominal pain, GU: no dysuria, burning on urination, increased urinary frequency, hematuria  Ext: no leg edema Neuro: no unilateral weakness, numbness, or tingling, no vision change or hearing loss Skin: no rash, no skin tear. MSK: No muscle spasm, no deformity, no limitation of range of movement in spin Heme: No easy bruising.  Travel history: No recent long distant travel.  Allergy:  Allergies  Allergen Reactions  . Levaquin [Levofloxacin] Other (See Comments)    MAY HAVE PRECIPITATED SEIZURE  . Codeine Anxiety    NERVOUS  . Penicillins Rash    Has patient had a PCN reaction causing immediate rash, facial/tongue/throat swelling, SOB or lightheadedness with hypotension:unsure Has patient had a PCN reaction causing severe rash involving mucus membranes or skin necrosis:unsure Has patient had a PCN reaction that required hospitalization:unsure Has patient had a PCN reaction occurring within the last 10 years:No If all of the above answers are "NO", then may proceed with Cephalosporin use.   . Promethazine Nausea And Vomiting    "I can take pills, not IV"  . Sulfa Antibiotics Itching and Rash  . Sulfasalazine Itching and Rash    Past Medical History:  Diagnosis Date  . Anemia    "when she was a child"  . Anxiety   . Cough    "chronic cough"  . GERD (gastroesophageal reflux disease)   . Heart murmur    in the past not sure if recent  . History of acute bronchitis   . HTN (hypertension)   . Hx of bladder infections   . Myocardial infarction [facility])    "heart attack from Takotsubo no  damage according to cath" in 2010  . Peripheral neuropathy   . Seizure disorder ([place])   . Seizures ([place])    last seizure 2010  . Takotsubo cardiomyopathy     Past Surgical History:  Procedure Laterality Date  . ABDOMINAL HYSTERECTOMY    . BREAST LUMPECTOMY Left    benign  . BREAST LUMPECTOMY WITH RADIOACTIVE SEED LOCALIZATION Right  01/23/2017   Procedure: RADIOACTIVE SEED GUIDED RIGHT BREAST LUMPECTOMY;  Surgeon: Rolm Bookbinder, MD;  Location: [place];  Service: General;  Laterality: Right;  . CARDIAC CATHETERIZATION  2010   No significant CAD. stress induced cardiomyopathy.   . CARPAL TUNNEL RELEASE Bilateral    2014  . CATARACT EXTRACTION W/ INTRAOCULAR LENS IMPLANT Bilateral   . CHOLECYSTECTOMY    . LAPAROSCOPIC ABDOMINAL EXPLORATION    . TONSILLECTOMY      Social History:  reports that she has never smoked. She has never used smokeless tobacco. She reports that she does not drink alcohol or use drugs.  Family History:  Family History  Problem Relation Age of Onset  . Heart failure Unknown   . Heart attack Unknown   . Cancer Unknown   . Arthritis Mother   . Cancer Mother   . Heart attack Father      Prior to Admission medications   Medication Sig Start Date End Date Taking? Authorizing Provider  acetaminophen (TYLENOL) 500 MG tablet Take 1,000 mg by mouth every 6 (six) hours as needed (for pain/headaches.).    Yes [provider]  albuterol (PROVENTIL) (2.5 MG/3ML) 0.083% nebulizer solution Take 2.5 mg by nebulization every 6 (six) hours as needed for wheezing.  05/12/18  Yes [provider]  bethanechol (URECHOLINE) 10 MG tablet Take 10 mg by mouth daily with breakfast.    Yes [provider]  budesonide-formoterol (SYMBICORT) 80-4.5 MCG/ACT inhaler Inhale 2 puffs into the lungs daily.  12/31/17  Yes [provider]  Carboxymethylcellulose Sod PF (REFRESH PLUS) 0.5 % SOLN Place 1 drop into both eyes 4 (four) times daily as needed (dry eyes).  02/25/17  Yes [provider]  Carboxymethylcellulose Sodium (REFRESH LIQUIGEL) 1 % GEL Place 1 application into both eyes See admin instructions. Instill under eyelid of both eyes every other night   Yes [provider]  carvedilol (COREG) 3.125 MG tablet Take 1 tablet (3.125 mg total) by mouth 2 (two) times daily with  a meal. 07/03/18  Yes Park Liter, MD  Cholecalciferol (VITAMIN D3) 2000 units TABS Take 2,000 Units by mouth daily with breakfast.    Yes [provider]  clonazePAM (KLONOPIN) 0.5 MG tablet Take 0.5 mg by mouth 2 (two) times daily with a meal. To prevent breakthrough seizures   Yes [provider]  clotrimazole-betamethasone (LOTRISONE) cream Apply 1 application topically 2 (two) times daily as needed (for vaginal discomfort).    Yes [provider]  dextromethorphan-guaiFENesin (MUCINEX DM) 30-600 MG 12hr tablet Take 1 tablet by mouth at bedtime as needed for cough.   Yes [provider]  donepezil (ARICEPT) 5 MG tablet Take 5 mg by mouth daily with supper.  03/29/17 11/02/18 Yes [provider]  Doxepin HCl (SILENOR) 3 MG TABS Take 1.5 mg by mouth at bedtime.    Yes [provider]  esomeprazole (NEXIUM) 40 MG capsule Take 40 mg by mouth daily with breakfast.    Yes [provider]  estradiol (ESTRACE) 0.1 MG/GM vaginal cream Place 1 Applicatorful vaginally at bedtime as needed (  for vaginal discomfort (dryness/itchy)).    Yes [provider]  GLUCOSAMINE-CHONDROITIN PO Take 1 tablet by mouth 2 (two) times daily with a meal.   Yes [provider]  guaifenesin (ROBITUSSIN) 100 MG/5ML syrup Take 200 mg by mouth 3 (three) times daily as needed for cough.   Yes [provider]  hydrocortisone (ANUSOL-HC) 2.5 % rectal cream Place 1 application rectally 2 (two) times daily as needed for hemorrhoids or itching.    Yes [provider]  levETIRAcetam (KEPPRA) 750 MG tablet Take 750 mg by mouth 2 (two) times daily with a meal. For seizures   Yes [provider]  levocetirizine (XYZAL) 5 MG tablet Take 5 mg by mouth daily with breakfast. Once a day as needed    Yes [provider]  memantine (NAMENDA) 5 MG tablet Take 5 mg by mouth daily with supper.  03/29/17  Yes [provider]    montelukast (SINGULAIR) 5 MG chewable tablet Chew 5 mg by mouth daily with supper.  06/30/18  Yes [provider]  Multiple Vitamin (MULTIVITAMIN WITH MINERALS) TABS tablet Take 1 tablet by mouth daily with supper.   Yes [provider]  Multiple Vitamins-Minerals (PRESERVISION AREDS 2 PO) Take 1 capsule by mouth 2 (two) times daily with a meal.    Yes [provider]  nitrofurantoin (MACRODANTIN) 50 MG capsule Take 50 mg by mouth daily with supper.  07/02/17  Yes [provider]  Omega-3 Fatty Acids (FISH OIL) 1000 MG CAPS Take 1,000 mg by mouth 2 (two) times daily with a meal.   Yes [provider]  PARoxetine (PAXIL) 10 MG tablet Take 10 mg by mouth daily at 2 PM.    Yes [provider]  pravastatin (PRAVACHOL) 20 MG tablet Take 1 tablet (20 mg total) by mouth daily at 6 PM. Patient taking differently: Take 20 mg by mouth daily with supper.  07/03/18  Yes Park Liter, MD  pregabalin (LYRICA) 50 MG capsule Take 50 mg by mouth 2 (two) times daily with a meal.    Yes [provider]  Probiotic Product (PROBIOTIC FORMULA) CAPS Take 1 capsule by mouth daily with breakfast.    Yes [provider]  silodosin (RAPAFLO) 4 MG CAPS capsule Take 4 mg by mouth daily with breakfast.    Yes [provider]  telmisartan (MICARDIS) 40 MG tablet Take 40 mg by mouth daily with breakfast.    Yes [provider]  tiotropium (SPIRIVA) 18 MCG inhalation capsule Place 18 mcg into inhaler and inhale at bedtime.   Yes [provider]    Physical Exam: Vitals:   08/03/18 1915 08/03/18 1930 08/03/18 1945 08/03/18 2033  BP: (!) 100/50 (!) 102/56 (!) 101/53 (!) 111/50  Pulse: 70 65 69 72  Resp: 17 20 19 18   Temp:      TempSrc:      SpO2: 95% 98% 96% 95%  Weight:      Height:       General: Not in acute distress HEENT:       Eyes: PERRL, EOMI, no scleral icterus.       ENT: No discharge from the ears and nose,  no pharynx injection, no tonsillar enlargement.        Neck: No JVD, no bruit, no mass felt. Heme: No neck lymph node enlargement. Cardiac: S1/S2, RRR, No murmurs, No gallops or rubs. Respiratory: No rales, wheezing, rhonchi or rubs. GI: Soft, nondistended, nontender, no rebound  pain, no organomegaly, BS present. GU: No hematuria Ext: No pitting leg edema bilaterally. 2+DP/PT pulse bilaterally. Musculoskeletal: No joint deformities, No joint redness or warmth, no limitation of ROM in spin. Skin: No rashes.  Neuro: Alert, oriented X3, cranial nerves II-XII grossly intact, moves all extremities.  Has bilateral leg weakness with muscle strength 3 out of 5. Psych: Patient is not psychotic, no suicidal or hemocidal ideation.  Labs on Admission: I have personally reviewed following labs and imaging studies  CBC: Recent Labs  Lab 08/03/18 1334  WBC 13.9*  HGB 15.7*  HCT 47.0*  MCV 90.6  PLT 010   Basic Metabolic Panel: Recent Labs  Lab 08/03/18 1334  NA 137  K 4.1  CL 103  CO2 24  GLUCOSE 110*  BUN 11  CREATININE 0.96  CALCIUM 9.6   GFR: Estimated Creatinine Clearance: 32.1 mL/min (by C-G formula based on SCr of 0.96 mg/dL). Liver Function Tests: No results for input(s): AST, ALT, ALKPHOS, BILITOT, PROT, ALBUMIN in the last 168 hours. No results for input(s): LIPASE, AMYLASE in the last 168 hours. No results for input(s): AMMONIA in the last 168 hours. Coagulation Profile: No results for input(s): INR, PROTIME in the last 168 hours. Cardiac Enzymes: No results for input(s): CKTOTAL, CKMB, CKMBINDEX, TROPONINI in the last 168 hours. BNP (last 3 results) No results for input(s): PROBNP in the last 8760 hours. HbA1C: No results for input(s): HGBA1C in the last 72 hours. CBG: Recent Labs  Lab 08/03/18 1615  GLUCAP 110*   Lipid Profile: No results for input(s): CHOL, HDL, LDLCALC, TRIG, CHOLHDL, LDLDIRECT in the last 72 hours. Thyroid Function Tests: No results for  input(s): TSH, T4TOTAL, FREET4, T3FREE, THYROIDAB in the last 72 hours. Anemia Panel: No results for input(s): VITAMINB12, FOLATE, FERRITIN, TIBC, IRON, RETICCTPCT in the last 72 hours. Urine analysis:    Component Value Date/Time   COLORURINE YELLOW 08/03/2018 [place] 08/03/2018 1650   LABSPEC 1.015 08/03/2018 1650   PHURINE 7.0 08/03/2018 1650   GLUCOSEU NEGATIVE 08/03/2018 1650   HGBUR NEGATIVE 08/03/2018 1650   BILIRUBINUR NEGATIVE 08/03/2018 1650   KETONESUR 20 (A) 08/03/2018 1650   PROTEINUR 30 (A) 08/03/2018 1650   NITRITE NEGATIVE 08/03/2018 1650   LEUKOCYTESUR NEGATIVE 08/03/2018 1650   Sepsis Labs: @LABRCNTIP (procalcitonin:4,lacticidven:4) )No results found for this or any previous visit (from the past 240 hour(s)).   Radiological Exams on Admission: Dg Chest 2 View  Result Date: 08/03/2018 CLINICAL DATA:  Fever with weakness and shortness of breath. EXAM: CHEST - 2 VIEW COMPARISON:  03/28/2018 and 12/31/2017 radiographs. FINDINGS: The heart size and mediastinal contours are stable allowing for positional differences. There is aortic atherosclerosis. There is new left perihilar opacity on the frontal examination, likely representing pneumonia in this clinical context. The right lung is clear. There is no pleural effusion or pneumothorax. Vertebral body anomaly and focal kyphosis near the thoracolumbar junction are again noted. IMPRESSION: New left perihilar opacity most consistent with pneumonia in this clinical context. Followup PA and lateral chest X-ray is recommended in 3-4 weeks following trial of antibiotic therapy to ensure resolution and exclude underlying malignancy. Electronically Signed   By: Richardean Sale M.D.   On: 08/03/2018 16:01   Ct Head Wo Contrast  Result Date: 08/03/2018 CLINICAL DATA:  Uncontrolled tremors over the last 2 days. Decreased responsiveness. History of hypertension and seizures. EXAM: CT HEAD WITHOUT CONTRAST TECHNIQUE:  Contiguous axial images were obtained from the base of the skull through the  vertex without intravenous contrast. COMPARISON:  CT head 03/09/2017 and 02/27/2017. FINDINGS: Brain: There is no evidence of acute intracranial hemorrhage, mass lesion, brain edema or extra-axial fluid collection. There is stable mild atrophy with mild ventricular dilatation. There is extensive confluent low-density in the periventricular and subcortical white matter, similar to previous study. There is no CT evidence of acute cortical infarction. Vascular: Intracranial vascular calcifications. No hyperdense vessel identified. Skull: The calvarium appears demineralized, especially the clivus. No focal marrow lesion or acute osseous findings. Sinuses/Orbits: Stable chronic partial opacification of the left sphenoid sinus and right mastoid air cells. No air-fluid levels. Previous bilateral lens surgery. Other: None. IMPRESSION: 1. No acute intracranial findings demonstrated. 2. Stable extensive periventricular white matter disease, calvarial demineralization and chronic sinus disease. Electronically Signed   By: Richardean Sale M.D.   On: 08/03/2018 17:32     EKG: Independently reviewed.  Sinus rhythm, QTC 474, PVC, LAE, low voltage, anteroseptal infarction pattern.   Assessment/Plan Principal Problem:   Lobar pneumonia (HCC) Active Problems:   Takotsubo cardiomyopathy   HTN (hypertension)   Dyslipidemia   Anxiety   GERD (gastroesophageal reflux disease)   Chronic obstructive pulmonary disease (HCC)   Depression   CAD (coronary artery disease)   Seizure (HCC)   Sepsis ([place])   Dementia  Sepsis due to lobar pneumonia: Patient meets criteria for sepsis with leukocytosis, fever.  Lactic acid is normal.  Blood pressure is soft, but currently hemodynamically stable.  - Will place on telemetry bed for obs. - IV Rocephin and azithromycin (pt also received one dose of Vancomycin and cefepime in ED) - Mucinex for cough  -  DuoNeb nebs and prn albuterol nebs, Dulera inhaler, Singulair - Urine legionella and S. pneumococcal antigen - Follow up blood culture x2, sputum culture and respiratory virus panel\ - will get Procalcitonin and trend lactic acid level per sepsis protocol - IVF: 1 of NS bolus in ED, followed by 75 mL per hour of NS (patient has Takotsubo cardiomyopathy, limiting aggressive IV fluids treatment)  COPD: no wheezing or rhonchi on auscultation. -See above for bronchodilators  Takotsubo cardiomyopathy: 2D echo on 07/28/2018 showed EF 60-65% with grade 1 diastolic dysfunction.  Patient does not have leg edema or JVD.  Does not seem to have CHF exacerbation. -Check BNP  HTN (hypertension): -Continue Coreg -Hold Micardis and silodosin since patient is a risk of developing hypotension due to sepsis -IV hydralazine as needed  Dyslipidemia: -Pravastatin  Dementia: no behavioral disturbance -Namenda, donepezil  GERD: -Protonix  CAD (coronary artery disease): no CP -continue Coreg and pravastatin  Depression and anxiety: Stable, no suicidal or homicidal ideations. -Continue home medications  Seizure -Seizure precaution -When necessary Ativan for seizure -Continue Home medications: Keppra -check Keppra level   DVT ppx: SQ Lovenox Code Status: Partial code (I discussed with the patient in the presence of daughter and son-in law, and explained the meaning of CODE STATUS, patient wants to be partial code, OK for CPR, but no intubation). Family Communication:  Yes, patient's  daughter and son-in law  at bed side Disposition Plan:  Anticipate discharge back to previous home environment Consults called:  none Admission status: Obs / tele     Date of Service 08/03/2018    Ivor Costa [facility] Pager [phone]  If 7PM-7AM, please contact night-coverage [email or web address] Password [facility] 08/03/2018, 8:47 PM

## 2018-08-04 DIAGNOSIS — A419 Sepsis, unspecified organism: Secondary | ICD-10-CM | POA: Diagnosis not present

## 2018-08-04 DIAGNOSIS — G40909 Epilepsy, unspecified, not intractable, without status epilepticus: Secondary | ICD-10-CM | POA: Diagnosis present

## 2018-08-04 DIAGNOSIS — R011 Cardiac murmur, unspecified: Secondary | ICD-10-CM | POA: Diagnosis present

## 2018-08-04 DIAGNOSIS — Z7951 Long term (current) use of inhaled steroids: Secondary | ICD-10-CM | POA: Diagnosis not present

## 2018-08-04 DIAGNOSIS — Z87440 Personal history of urinary (tract) infections: Secondary | ICD-10-CM | POA: Diagnosis not present

## 2018-08-04 DIAGNOSIS — I5181 Takotsubo syndrome: Secondary | ICD-10-CM | POA: Diagnosis present

## 2018-08-04 DIAGNOSIS — G629 Polyneuropathy, unspecified: Secondary | ICD-10-CM | POA: Diagnosis present

## 2018-08-04 DIAGNOSIS — Z9842 Cataract extraction status, left eye: Secondary | ICD-10-CM | POA: Diagnosis not present

## 2018-08-04 DIAGNOSIS — Z961 Presence of intraocular lens: Secondary | ICD-10-CM | POA: Diagnosis present

## 2018-08-04 DIAGNOSIS — F419 Anxiety disorder, unspecified: Secondary | ICD-10-CM | POA: Diagnosis not present

## 2018-08-04 DIAGNOSIS — Z9841 Cataract extraction status, right eye: Secondary | ICD-10-CM | POA: Diagnosis not present

## 2018-08-04 DIAGNOSIS — I252 Old myocardial infarction: Secondary | ICD-10-CM | POA: Diagnosis not present

## 2018-08-04 DIAGNOSIS — Z90710 Acquired absence of both cervix and uterus: Secondary | ICD-10-CM | POA: Diagnosis not present

## 2018-08-04 DIAGNOSIS — Z809 Family history of malignant neoplasm, unspecified: Secondary | ICD-10-CM | POA: Diagnosis not present

## 2018-08-04 DIAGNOSIS — Z8261 Family history of arthritis: Secondary | ICD-10-CM | POA: Diagnosis not present

## 2018-08-04 DIAGNOSIS — Z79899 Other long term (current) drug therapy: Secondary | ICD-10-CM | POA: Diagnosis not present

## 2018-08-04 DIAGNOSIS — E785 Hyperlipidemia, unspecified: Secondary | ICD-10-CM | POA: Diagnosis not present

## 2018-08-04 DIAGNOSIS — I10 Essential (primary) hypertension: Secondary | ICD-10-CM | POA: Diagnosis present

## 2018-08-04 DIAGNOSIS — K219 Gastro-esophageal reflux disease without esophagitis: Secondary | ICD-10-CM | POA: Diagnosis present

## 2018-08-04 DIAGNOSIS — R569 Unspecified convulsions: Secondary | ICD-10-CM | POA: Diagnosis not present

## 2018-08-04 DIAGNOSIS — J181 Lobar pneumonia, unspecified organism: Secondary | ICD-10-CM | POA: Diagnosis not present

## 2018-08-04 DIAGNOSIS — R0902 Hypoxemia: Secondary | ICD-10-CM | POA: Diagnosis present

## 2018-08-04 DIAGNOSIS — J449 Chronic obstructive pulmonary disease, unspecified: Secondary | ICD-10-CM | POA: Diagnosis not present

## 2018-08-04 DIAGNOSIS — Z9049 Acquired absence of other specified parts of digestive tract: Secondary | ICD-10-CM | POA: Diagnosis not present

## 2018-08-04 DIAGNOSIS — Z853 Personal history of malignant neoplasm of breast: Secondary | ICD-10-CM | POA: Diagnosis not present

## 2018-08-04 DIAGNOSIS — J440 Chronic obstructive pulmonary disease with acute lower respiratory infection: Secondary | ICD-10-CM | POA: Diagnosis present

## 2018-08-04 DIAGNOSIS — R251 Tremor, unspecified: Secondary | ICD-10-CM | POA: Diagnosis present

## 2018-08-04 LAB — RESPIRATORY PANEL BY PCR
Adenovirus: NOT DETECTED
BORDETELLA PERTUSSIS-RVPCR: NOT DETECTED
Chlamydophila pneumoniae: NOT DETECTED
Coronavirus 229E: NOT DETECTED
Coronavirus HKU1: NOT DETECTED
Coronavirus NL63: NOT DETECTED
Coronavirus OC43: NOT DETECTED
INFLUENZA A-RVPPCR: NOT DETECTED
Influenza A H1 2009: NOT DETECTED
Influenza A H1: NOT DETECTED
Influenza A H3: NOT DETECTED
Influenza B: NOT DETECTED
METAPNEUMOVIRUS-RVPPCR: NOT DETECTED
Mycoplasma pneumoniae: NOT DETECTED
PARAINFLUENZA VIRUS 2-RVPPCR: NOT DETECTED
PARAINFLUENZA VIRUS 3-RVPPCR: NOT DETECTED
PARAINFLUENZA VIRUS 4-RVPPCR: NOT DETECTED
Parainfluenza Virus 1: NOT DETECTED
RESPIRATORY SYNCYTIAL VIRUS-RVPPCR: NOT DETECTED
RHINOVIRUS / ENTEROVIRUS - RVPPCR: NOT DETECTED

## 2018-08-04 LAB — HIV ANTIBODY (ROUTINE TESTING W REFLEX): HIV Screen 4th Generation wRfx: NONREACTIVE

## 2018-08-04 LAB — STREP PNEUMONIAE URINARY ANTIGEN: Strep Pneumo Urinary Antigen: NEGATIVE

## 2018-08-04 MED ORDER — DOXEPIN HCL 10 MG/ML PO CONC
1.5000 mg | Freq: Every day | ORAL | Status: DC
  Administered 2018-08-04 – 2018-08-06 (×3): 1.5 mg via ORAL
  Filled 2018-08-04 (×3): qty 0.2

## 2018-08-04 MED ORDER — IPRATROPIUM-ALBUTEROL 0.5-2.5 (3) MG/3ML IN SOLN
3.0000 mL | Freq: Three times a day (TID) | RESPIRATORY_TRACT | Status: DC
  Administered 2018-08-04 – 2018-08-09 (×16): 3 mL via RESPIRATORY_TRACT
  Filled 2018-08-04 (×18): qty 3

## 2018-08-04 NOTE — Progress Notes (Signed)
PT Cancellation Note  Patient Details Name: KORINE WINTON MRN: [id number] DOB: 02-Jun-1927   Cancelled Treatment:    Reason Eval/Treat Not Completed: (P) Patient not medically ready Pt RN request that pt be seen tomorrow as she is very lethargic. PT will follow back tomorrow.   Brilyn Tuller B. Migdalia Dk PT, DPT Acute Rehabilitation Services Pager [phone] Office [phone]    [place] 08/04/2018, 3:38 PM

## 2018-08-04 NOTE — Progress Notes (Addendum)
PROGRESS NOTE        PATIENT DETAILS Name: [name] Age: 84 y.o. Sex: female Date of Birth: 12/22/1926 Admit Date: 08/03/2018 Admitting Physician Ivor Costa, MD ZOX:[id number], Audree Camel, MD  Brief Narrative: Patient is a 84 y.o. female with prior history of COPD, tachycardia Subu cardiomyopathy, seizure disorder, anxiety, GERD-who presented with 4-5-day history of worsening shortness of breath, cough,subjective fever and generalized weakness.  She was thought to have pneumonia and admitted to the hospitalist service.  See below for further details.  Subjective: Appears weak-claims of shortness of breath is better  but not at baseline.  Looks very weak and deconditioned.  Daughter at bedside.  Assessment/Plan: Sepsis secondary to lobar pneumonia: Suspect this is mostly community acquired pneumonia-sepsis pathophysiology has improved.  Continue Rocephin and Zithromax-await culture data, respiratory virus panel.  Mobilize-out of bed to chair-await PT services as well.  COPD: No wheezing-continue bronchodilators.  History of takatsubo cardiomyopathy: Volume status stable.  Minimize IV fluids as much as possible.  Recent EF on TTE was 60 to 65%  Hypertension: Controlled with Coreg-continue to hold other antihypertensives  Seizure disorder: Continue Keppra  Anxiety/depression: Appears relatively stable-continue Klonopin, doxepin and Paxil  CAD: No anginal symptoms  Dyslipidemia: Continue statin  GERD: Continue Protonix  Dementia: Slow but awake and alert-suspect close to usual baseline-continue Namenda and Aricept.  Acute on chronic debility/weakness: Worsening generalized weakness secondary to acute illness-await PT/OT eval.  May need SNF.  Not at baseline she walks with a walker at home-she is unable to ambulate over the past few days per patient's daughter.  DVT Prophylaxis: Prophylactic Lovenox   Code Status: DNI-reconfirmed with patient and  daughter at bedside this morning.  Family Communication: Daughter at bedside  Disposition Plan: Remain inpatient-but will plan on Home health vs SNF on discharge-later this week  Antimicrobial agents: Anti-infectives (From admission, onward)   Start     Dose/Rate Route Frequency Ordered Stop   08/03/18 2100  azithromycin (ZITHROMAX) 500 mg in sodium chloride 0.9 % 250 mL IVPB     500 mg 250 mL/hr over 60 Minutes Intravenous Every 24 hours 08/03/18 1956 08/10/18 2059   08/03/18 2030  cefTRIAXone (ROCEPHIN) 1 g in sodium chloride 0.9 % 100 mL IVPB     1 g 200 mL/hr over 30 Minutes Intravenous Every 24 hours 08/03/18 1956 08/10/18 2029   08/03/18 1530  ceFEPIme (MAXIPIME) 2 g in sodium chloride 0.9 % 100 mL IVPB     2 g 200 mL/hr over 30 Minutes Intravenous  Once 08/03/18 1522 08/03/18 1649   08/03/18 1530  metroNIDAZOLE (FLAGYL) IVPB 500 mg  Status:  Discontinued     500 mg 100 mL/hr over 60 Minutes Intravenous Every 8 hours 08/03/18 1522 08/03/18 1947   08/03/18 1530  vancomycin (VANCOCIN) IVPB 1000 mg/200 mL premix     1,000 mg 200 mL/hr over 60 Minutes Intravenous  Once 08/03/18 1522 08/03/18 1856      Procedures: None  CONSULTS:  None  Time spent: 25- minutes-Greater than 50% of this time was spent in counseling, explanation of diagnosis, planning of further management, and coordination of care.  MEDICATIONS: Scheduled Meds: . acidophilus  1 capsule Oral Q breakfast  . bethanechol  10 mg Oral Q breakfast  . carvedilol  3.125 mg Oral BID WC  . cholecalciferol  2,000 Units Oral Daily  .  clonazePAM  0.5 mg Oral BID WC  . donepezil  5 mg Oral Q supper  . Doxepin HCl  1.5 mg Oral QHS  . enoxaparin (LOVENOX) injection  40 mg Subcutaneous Q24H  . ipratropium-albuterol  3 mL Nebulization TID  . levETIRAcetam  750 mg Oral BID WC  . memantine  5 mg Oral Q supper  . mometasone-formoterol  2 puff Inhalation BID  . montelukast  5 mg Oral Q supper  . multivitamin with  minerals  1 tablet Oral Q supper  . omega-3 acid ethyl esters  1 g Oral Daily  . pantoprazole  40 mg Oral Daily  . PARoxetine  10 mg Oral Q1400  . pravastatin  20 mg Oral Q supper  . pregabalin  50 mg Oral BID WC   Continuous Infusions: . sodium chloride 75 mL/hr at 08/03/18 2152  . azithromycin 500 mg (08/03/18 2251)  . cefTRIAXone (ROCEPHIN)  IV 1 g (08/03/18 2154)   PRN Meds:.acetaminophen, albuterol, dextromethorphan-guaiFENesin, estradiol, hydrALAZINE, hydrocortisone, loratadine, LORazepam, ondansetron (ZOFRAN) IV, polyvinyl alcohol, zolpidem   PHYSICAL EXAM: Vital signs: Vitals:   08/03/18 2320 08/03/18 2330 08/04/18 0000 08/04/18 0803  BP:  (!) 119/56    Pulse: 76 72    Resp: 18 (!) 22    Temp:  100.2 F (37.9 C) 98.3 F (36.8 C)   TempSrc:  Oral Oral   SpO2:  93%  90%  Weight:      Height:       Filed Weights   08/03/18 1322  Weight: 60.8 kg   Body mass index is 27.06 kg/m.   General appearance :Awake, alert, not in any distress.  Looks very weak. Eyes:Pink conjunctiva HEENT: Atraumatic and Normocephalic Neck: supple Resp:Good air entry bilaterally, some bibasilar rales-mostly on the left compared to the right. CVS: S1 S2 regular, no murmurs.  GI: Bowel sounds present, Non tender and not distended with no gaurding, rigidity or rebound.No organomegaly Extremities: B/L Lower Ext shows no edema, both legs are warm to touch Neurology: Generalized weakness but nonfocal Musculoskeletal:No digital cyanosis Skin:No Rash, warm and dry Wounds:N/A  I have personally reviewed following labs and imaging studies  LABORATORY DATA: CBC: Recent Labs  Lab 08/03/18 1334  WBC 13.9*  HGB 15.7*  HCT 47.0*  MCV 90.6  PLT 761    Basic Metabolic Panel: Recent Labs  Lab 08/03/18 1334  NA 137  K 4.1  CL 103  CO2 24  GLUCOSE 110*  BUN 11  CREATININE 0.96  CALCIUM 9.6    GFR: Estimated Creatinine Clearance: 32.1 mL/min (by C-G formula based on SCr of 0.96  mg/dL).  Liver Function Tests: No results for input(s): AST, ALT, ALKPHOS, BILITOT, PROT, ALBUMIN in the last 168 hours. No results for input(s): LIPASE, AMYLASE in the last 168 hours. No results for input(s): AMMONIA in the last 168 hours.  Coagulation Profile: No results for input(s): INR, PROTIME in the last 168 hours.  Cardiac Enzymes: No results for input(s): CKTOTAL, CKMB, CKMBINDEX, TROPONINI in the last 168 hours.  BNP (last 3 results) No results for input(s): PROBNP in the last 8760 hours.  HbA1C: No results for input(s): HGBA1C in the last 72 hours.  CBG: Recent Labs  Lab 08/03/18 1615  GLUCAP 110*    Lipid Profile: No results for input(s): CHOL, HDL, LDLCALC, TRIG, CHOLHDL, LDLDIRECT in the last 72 hours.  Thyroid Function Tests: No results for input(s): TSH, T4TOTAL, FREET4, T3FREE, THYROIDAB in the last 72 hours.  Anemia Panel: No  results for input(s): VITAMINB12, FOLATE, FERRITIN, TIBC, IRON, RETICCTPCT in the last 72 hours.  Urine analysis:    Component Value Date/Time   COLORURINE YELLOW 08/03/2018 [place] 08/03/2018 1650   LABSPEC 1.015 08/03/2018 1650   PHURINE 7.0 08/03/2018 1650   GLUCOSEU NEGATIVE 08/03/2018 1650   HGBUR NEGATIVE 08/03/2018 1650   BILIRUBINUR NEGATIVE 08/03/2018 1650   KETONESUR 20 (A) 08/03/2018 1650   PROTEINUR 30 (A) 08/03/2018 1650   NITRITE NEGATIVE 08/03/2018 1650   LEUKOCYTESUR NEGATIVE 08/03/2018 1650    Sepsis Labs: Lactic Acid, Venous    Component Value Date/Time   LATICACIDVEN 1.6 08/03/2018 2111    MICROBIOLOGY: Recent Results (from the past 240 hour(s))  Culture, blood (Routine X 2) w Reflex to ID Panel     Status: None (Preliminary result)   Collection Time: 08/03/18  1:35 PM  Result Value Ref Range Status   Specimen Description BLOOD LEFT ANTECUBITAL  Final   Special Requests   Final    BOTTLES DRAWN AEROBIC AND ANAEROBIC Blood Culture adequate volume   Culture   Final    NO  GROWTH < 24 HOURS Performed at [facility], [place] [address] [place], [place] [address]    Report Status PENDING  Incomplete  Culture, blood (Routine X 2) w Reflex to ID Panel     Status: None (Preliminary result)   Collection Time: 08/03/18  2:45 PM  Result Value Ref Range Status   Specimen Description BLOOD LEFT WRIST  Final   Special Requests   Final    BOTTLES DRAWN AEROBIC AND ANAEROBIC Blood Culture adequate volume   Culture   Final    NO GROWTH < 12 HOURS Performed at [facility], [place] [address], [place], [place] [address]    Report Status PENDING  Incomplete  Respiratory Panel by PCR     Status: None   Collection Time: 08/03/18  7:54 PM  Result Value Ref Range Status   Adenovirus NOT DETECTED NOT DETECTED Final   Coronavirus 229E NOT DETECTED NOT DETECTED Final   Coronavirus HKU1 NOT DETECTED NOT DETECTED Final   Coronavirus NL63 NOT DETECTED NOT DETECTED Final   Coronavirus OC43 NOT DETECTED NOT DETECTED Final   Metapneumovirus NOT DETECTED NOT DETECTED Final   Rhinovirus / Enterovirus NOT DETECTED NOT DETECTED Final   Influenza A NOT DETECTED NOT DETECTED Final   Influenza A H1 NOT DETECTED NOT DETECTED Final   Influenza A H1 2009 NOT DETECTED NOT DETECTED Final   Influenza A H3 NOT DETECTED NOT DETECTED Final   Influenza B NOT DETECTED NOT DETECTED Final   Parainfluenza Virus 1 NOT DETECTED NOT DETECTED Final   Parainfluenza Virus 2 NOT DETECTED NOT DETECTED Final   Parainfluenza Virus 3 NOT DETECTED NOT DETECTED Final   Parainfluenza Virus 4 NOT DETECTED NOT DETECTED Final   Respiratory Syncytial Virus NOT DETECTED NOT DETECTED Final   Bordetella pertussis NOT DETECTED NOT DETECTED Final   Chlamydophila pneumoniae NOT DETECTED NOT DETECTED Final   Mycoplasma pneumoniae NOT DETECTED NOT DETECTED Final    Comment: Performed at [facility], [place] [address], [place], [place] [address]    RADIOLOGY STUDIES/RESULTS: Dg Chest 2 View  Result Date:  08/03/2018 CLINICAL DATA:  Fever with weakness and shortness of breath. EXAM: CHEST - 2 VIEW COMPARISON:  03/28/2018 and 12/31/2017 radiographs. FINDINGS: The heart size and mediastinal contours are stable allowing for positional differences. There is aortic atherosclerosis. There is new  left perihilar opacity on the frontal examination, likely representing pneumonia in this clinical context. The right lung is clear. There is no pleural effusion or pneumothorax. Vertebral body anomaly and focal kyphosis near the thoracolumbar junction are again noted. IMPRESSION: New left perihilar opacity most consistent with pneumonia in this clinical context. Followup PA and lateral chest X-ray is recommended in 3-4 weeks following trial of antibiotic therapy to ensure resolution and exclude underlying malignancy. Electronically Signed   By: Richardean Sale M.D.   On: 08/03/2018 16:01   Ct Head Wo Contrast  Result Date: 08/03/2018 CLINICAL DATA:  Uncontrolled tremors over the last 2 days. Decreased responsiveness. History of hypertension and seizures. EXAM: CT HEAD WITHOUT CONTRAST TECHNIQUE: Contiguous axial images were obtained from the base of the skull through the vertex without intravenous contrast. COMPARISON:  CT head 03/09/2017 and 02/27/2017. FINDINGS: Brain: There is no evidence of acute intracranial hemorrhage, mass lesion, brain edema or extra-axial fluid collection. There is stable mild atrophy with mild ventricular dilatation. There is extensive confluent low-density in the periventricular and subcortical white matter, similar to previous study. There is no CT evidence of acute cortical infarction. Vascular: Intracranial vascular calcifications. No hyperdense vessel identified. Skull: The calvarium appears demineralized, especially the clivus. No focal marrow lesion or acute osseous findings. Sinuses/Orbits: Stable chronic partial opacification of the left sphenoid sinus and right mastoid air cells. No air-fluid  levels. Previous bilateral lens surgery. Other: None. IMPRESSION: 1. No acute intracranial findings demonstrated. 2. Stable extensive periventricular white matter disease, calvarial demineralization and chronic sinus disease. Electronically Signed   By: Richardean Sale M.D.   On: 08/03/2018 17:32     LOS: 0 days   Oren Binet, MD  [facility]  If 7PM-7AM, please contact night-coverage  Please page via [email or web address] [email or web address]-click on MD name and type text message  08/04/2018, 10:14 AM

## 2018-08-05 LAB — BASIC METABOLIC PANEL
ANION GAP: 8 (ref 5–15)
BUN: 10 mg/dL (ref 8–23)
CALCIUM: 8.4 mg/dL — AB (ref 8.9–10.3)
CO2: 20 mmol/L — AB (ref 22–32)
Chloride: 109 mmol/L (ref 98–111)
Creatinine, Ser: 0.81 mg/dL (ref 0.44–1.00)
Glucose, Bld: 98 mg/dL (ref 70–99)
Potassium: 3.5 mmol/L (ref 3.5–5.1)
Sodium: 137 mmol/L (ref 135–145)

## 2018-08-05 LAB — CBC
HEMATOCRIT: 34.5 % — AB (ref 36.0–46.0)
Hemoglobin: 11.4 g/dL — ABNORMAL LOW (ref 12.0–15.0)
MCH: 29.9 pg (ref 26.0–34.0)
MCHC: 33 g/dL (ref 30.0–36.0)
MCV: 90.6 fL (ref 78.0–100.0)
Platelets: 152 10*3/uL (ref 150–400)
RBC: 3.81 MIL/uL — ABNORMAL LOW (ref 3.87–5.11)
RDW: 11.9 % (ref 11.5–15.5)
WBC: 8.8 10*3/uL (ref 4.0–10.5)

## 2018-08-05 NOTE — Progress Notes (Signed)
PROGRESS NOTE        PATIENT DETAILS Name: [name] Age: 84 y.o. Sex: female Date of Birth: Oct 12, 1927 Admit Date: 08/03/2018 Admitting Physician Ivor Costa, MD IRS:[id number], Audree Camel, MD  Brief Narrative: Patient is a 84 y.o. female with prior history of COPD, tachycardia Subu cardiomyopathy, seizure disorder, anxiety, GERD-who presented with 4-5-day history of worsening shortness of breath, cough,subjective fever and generalized weakness.  She was thought to have pneumonia and admitted to the hospitalist service.  See below for further details.  Subjective: Appears weak but is awake and alert.  Cough is better.  Shortness of breath is better.  Assessment/Plan: Sepsis secondary to lobar pneumonia: Sepsis pathophysiology has improved, blood cultures negative, respiratory virus panel negative-she appears significantly weak and deconditioned-improved-she is not yet back to baseline.  Continue Rocephin and Zithromax.  Suspect will need SNF on discharge.  Mobilize with PT-out of bed to chair.    COPD: Stable-not wheezing-continue bronchodilators.   History of takatsubo cardiomyopathy: Volume status stable-stop all IV fluids.  Recent EF on TTE (9/16) was 60-65%.    Hypertension: BP controlled-continue Coreg-continue to hold all other antihypertensives-resume when able.  Continue to hold all antihypertensives.    Seizure disorder: Stable-continue with  Keppra  Anxiety/depression: Appears stable-continue Klonopin, doxepin and Paxil.    CAD: No anginal symptoms-appears stable.  Dyslipidemia: Continue statin  GERD: Continue Protonix  Dementia: Slow but awake and alert-suspect close to usual baseline-continue Namenda and Aricept.  Acute on chronic debility/weakness: Worsening generalized weakness secondary to acute illness-awaiting PT/OT evaluation-suspect will require SNF.  At baseline she walks around the house with the help of a walker.    DVT  Prophylaxis: Prophylactic Lovenox   Code Status: DNI  Family Communication: Daughter at bedside  Disposition Plan: Remain inpatient-SNF later this week  Antimicrobial agents: Anti-infectives (From admission, onward)   Start     Dose/Rate Route Frequency Ordered Stop   08/03/18 2100  azithromycin (ZITHROMAX) 500 mg in sodium chloride 0.9 % 250 mL IVPB     500 mg 250 mL/hr over 60 Minutes Intravenous Every 24 hours 08/03/18 1956 08/10/18 2059   08/03/18 2030  cefTRIAXone (ROCEPHIN) 1 g in sodium chloride 0.9 % 100 mL IVPB     1 g 200 mL/hr over 30 Minutes Intravenous Every 24 hours 08/03/18 1956 08/10/18 2029   08/03/18 1530  ceFEPIme (MAXIPIME) 2 g in sodium chloride 0.9 % 100 mL IVPB     2 g 200 mL/hr over 30 Minutes Intravenous  Once 08/03/18 1522 08/03/18 1649   08/03/18 1530  metroNIDAZOLE (FLAGYL) IVPB 500 mg  Status:  Discontinued     500 mg 100 mL/hr over 60 Minutes Intravenous Every 8 hours 08/03/18 1522 08/03/18 1947   08/03/18 1530  vancomycin (VANCOCIN) IVPB 1000 mg/200 mL premix     1,000 mg 200 mL/hr over 60 Minutes Intravenous  Once 08/03/18 1522 08/03/18 1856      Procedures: None  CONSULTS:  None  Time spent: 25- minutes-Greater than 50% of this time was spent in counseling, explanation of diagnosis, planning of further management, and coordination of care.  MEDICATIONS: Scheduled Meds: . acidophilus  1 capsule Oral Q breakfast  . bethanechol  10 mg Oral Q breakfast  . carvedilol  3.125 mg Oral BID WC  . cholecalciferol  2,000 Units Oral Daily  . clonazePAM  0.5 mg Oral BID WC  . donepezil  5 mg Oral Q supper  . doxepin  1.5 mg Oral QHS  . enoxaparin (LOVENOX) injection  40 mg Subcutaneous Q24H  . ipratropium-albuterol  3 mL Nebulization TID  . levETIRAcetam  750 mg Oral BID WC  . memantine  5 mg Oral Q supper  . mometasone-formoterol  2 puff Inhalation BID  . montelukast  5 mg Oral Q supper  . multivitamin with minerals  1 tablet Oral Q supper    . omega-3 acid ethyl esters  1 g Oral Daily  . pantoprazole  40 mg Oral Daily  . PARoxetine  10 mg Oral Q1400  . pravastatin  20 mg Oral Q supper  . pregabalin  50 mg Oral BID WC   Continuous Infusions: . sodium chloride 10 mL/hr at 08/05/18 0642  . azithromycin Stopped (08/04/18 2302)  . cefTRIAXone (ROCEPHIN)  IV Stopped (08/04/18 2050)   PRN Meds:.acetaminophen, albuterol, dextromethorphan-guaiFENesin, estradiol, hydrALAZINE, hydrocortisone, loratadine, LORazepam, ondansetron (ZOFRAN) IV, polyvinyl alcohol, zolpidem   PHYSICAL EXAM: Vital signs: Vitals:   08/05/18 0600 08/05/18 0758 08/05/18 0855 08/05/18 0922  BP:  (!) 133/52  (!) 147/57  Pulse:  82  87  Resp: (!) 21  (!) 24   Temp:  (!) 97.4 F (36.3 C)    TempSrc:  Oral    SpO2:  94% 96%   Weight:      Height:       Filed Weights   08/03/18 1322  Weight: 60.8 kg   Body mass index is 27.06 kg/m.   General appearance:Awake, alert, not in any distress.  Looks weak Eyes:no scleral icterus. HEENT: Atraumatic and Normocephalic Neck: supple, no JVD. Resp:Good air entry bilaterally,no rales or rhonchi CVS: S1 S2 regular GI: Bowel sounds present, Non tender and not distended with no gaurding, rigidity or rebound. Extremities: B/L Lower Ext shows no edema, both legs are warm to touch Neurology:  Non focal Musculoskeletal:No digital cyanosis Skin:No Rash, warm and dry Wounds:N/A  I have personally reviewed following labs and imaging studies  LABORATORY DATA: CBC: Recent Labs  Lab 08/03/18 1334 08/05/18 0404  WBC 13.9* 8.8  HGB 15.7* 11.4*  HCT 47.0* 34.5*  MCV 90.6 90.6  PLT 171 161    Basic Metabolic Panel: Recent Labs  Lab 08/03/18 1334 08/05/18 0404  NA 137 137  K 4.1 3.5  CL 103 109  CO2 24 20*  GLUCOSE 110* 98  BUN 11 10  CREATININE 0.96 0.81  CALCIUM 9.6 8.4*    GFR: Estimated Creatinine Clearance: 38 mL/min (by C-G formula based on SCr of 0.81 mg/dL).  Liver Function Tests: No  results for input(s): AST, ALT, ALKPHOS, BILITOT, PROT, ALBUMIN in the last 168 hours. No results for input(s): LIPASE, AMYLASE in the last 168 hours. No results for input(s): AMMONIA in the last 168 hours.  Coagulation Profile: No results for input(s): INR, PROTIME in the last 168 hours.  Cardiac Enzymes: No results for input(s): CKTOTAL, CKMB, CKMBINDEX, TROPONINI in the last 168 hours.  BNP (last 3 results) No results for input(s): PROBNP in the last 8760 hours.  HbA1C: No results for input(s): HGBA1C in the last 72 hours.  CBG: Recent Labs  Lab 08/03/18 1615  GLUCAP 110*    Lipid Profile: No results for input(s): CHOL, HDL, LDLCALC, TRIG, CHOLHDL, LDLDIRECT in the last 72 hours.  Thyroid Function Tests: No results for input(s): TSH, T4TOTAL, FREET4, T3FREE, THYROIDAB in the last 72 hours.  Anemia  Panel: No results for input(s): VITAMINB12, FOLATE, FERRITIN, TIBC, IRON, RETICCTPCT in the last 72 hours.  Urine analysis:    Component Value Date/Time   COLORURINE YELLOW 08/03/2018 [place] 08/03/2018 1650   LABSPEC 1.015 08/03/2018 1650   PHURINE 7.0 08/03/2018 1650   GLUCOSEU NEGATIVE 08/03/2018 1650   HGBUR NEGATIVE 08/03/2018 1650   BILIRUBINUR NEGATIVE 08/03/2018 1650   KETONESUR 20 (A) 08/03/2018 1650   PROTEINUR 30 (A) 08/03/2018 1650   NITRITE NEGATIVE 08/03/2018 1650   LEUKOCYTESUR NEGATIVE 08/03/2018 1650    Sepsis Labs: Lactic Acid, Venous    Component Value Date/Time   LATICACIDVEN 1.6 08/03/2018 2111    MICROBIOLOGY: Recent Results (from the past 240 hour(s))  Culture, blood (Routine X 2) w Reflex to ID Panel     Status: None (Preliminary result)   Collection Time: 08/03/18  1:35 PM  Result Value Ref Range Status   Specimen Description BLOOD LEFT ANTECUBITAL  Final   Special Requests   Final    BOTTLES DRAWN AEROBIC AND ANAEROBIC Blood Culture adequate volume   Culture   Final    NO GROWTH 2 DAYS Performed at [facility], [place] [address], [place], [place] [address]    Report Status PENDING  Incomplete  Culture, blood (Routine X 2) w Reflex to ID Panel     Status: None (Preliminary result)   Collection Time: 08/03/18  2:45 PM  Result Value Ref Range Status   Specimen Description BLOOD LEFT WRIST  Final   Special Requests   Final    BOTTLES DRAWN AEROBIC AND ANAEROBIC Blood Culture adequate volume   Culture   Final    NO GROWTH 2 DAYS Performed at [facility], [place] [address] [place], [place] [address]    Report Status PENDING  Incomplete  Respiratory Panel by PCR     Status: None   Collection Time: 08/03/18  7:54 PM  Result Value Ref Range Status   Adenovirus NOT DETECTED NOT DETECTED Final   Coronavirus 229E NOT DETECTED NOT DETECTED Final   Coronavirus HKU1 NOT DETECTED NOT DETECTED Final   Coronavirus NL63 NOT DETECTED NOT DETECTED Final   Coronavirus OC43 NOT DETECTED NOT DETECTED Final   Metapneumovirus NOT DETECTED NOT DETECTED Final   Rhinovirus / Enterovirus NOT DETECTED NOT DETECTED Final   Influenza A NOT DETECTED NOT DETECTED Final   Influenza A H1 NOT DETECTED NOT DETECTED Final   Influenza A H1 2009 NOT DETECTED NOT DETECTED Final   Influenza A H3 NOT DETECTED NOT DETECTED Final   Influenza B NOT DETECTED NOT DETECTED Final   Parainfluenza Virus 1 NOT DETECTED NOT DETECTED Final   Parainfluenza Virus 2 NOT DETECTED NOT DETECTED Final   Parainfluenza Virus 3 NOT DETECTED NOT DETECTED Final   Parainfluenza Virus 4 NOT DETECTED NOT DETECTED Final   Respiratory Syncytial Virus NOT DETECTED NOT DETECTED Final   Bordetella pertussis NOT DETECTED NOT DETECTED Final   Chlamydophila pneumoniae NOT DETECTED NOT DETECTED Final   Mycoplasma pneumoniae NOT DETECTED NOT DETECTED Final    Comment: Performed at [facility], [place] [address] [place], [place] [address]    RADIOLOGY STUDIES/RESULTS: Dg Chest 2 View  Result Date: 08/03/2018 CLINICAL DATA:  Fever with weakness  and shortness of breath. EXAM: CHEST - 2 VIEW COMPARISON:  03/28/2018 and 12/31/2017 radiographs. FINDINGS: The heart size and mediastinal contours are stable allowing for positional differences. There is aortic atherosclerosis. There is new  left perihilar opacity on the frontal examination, likely representing pneumonia in this clinical context. The right lung is clear. There is no pleural effusion or pneumothorax. Vertebral body anomaly and focal kyphosis near the thoracolumbar junction are again noted. IMPRESSION: New left perihilar opacity most consistent with pneumonia in this clinical context. Followup PA and lateral chest X-ray is recommended in 3-4 weeks following trial of antibiotic therapy to ensure resolution and exclude underlying malignancy. Electronically Signed   By: Richardean Sale M.D.   On: 08/03/2018 16:01   Ct Head Wo Contrast  Result Date: 08/03/2018 CLINICAL DATA:  Uncontrolled tremors over the last 2 days. Decreased responsiveness. History of hypertension and seizures. EXAM: CT HEAD WITHOUT CONTRAST TECHNIQUE: Contiguous axial images were obtained from the base of the skull through the vertex without intravenous contrast. COMPARISON:  CT head 03/09/2017 and 02/27/2017. FINDINGS: Brain: There is no evidence of acute intracranial hemorrhage, mass lesion, brain edema or extra-axial fluid collection. There is stable mild atrophy with mild ventricular dilatation. There is extensive confluent low-density in the periventricular and subcortical white matter, similar to previous study. There is no CT evidence of acute cortical infarction. Vascular: Intracranial vascular calcifications. No hyperdense vessel identified. Skull: The calvarium appears demineralized, especially the clivus. No focal marrow lesion or acute osseous findings. Sinuses/Orbits: Stable chronic partial opacification of the left sphenoid sinus and right mastoid air cells. No air-fluid levels. Previous bilateral lens surgery.  Other: None. IMPRESSION: 1. No acute intracranial findings demonstrated. 2. Stable extensive periventricular white matter disease, calvarial demineralization and chronic sinus disease. Electronically Signed   By: Richardean Sale M.D.   On: 08/03/2018 17:32     LOS: 1 day   Oren Binet, MD  [facility]  If 7PM-7AM, please contact night-coverage  Please page via [email or web address]-click on MD name and type text message  08/05/2018, 11:26 AM

## 2018-08-05 NOTE — NC FL2 (Signed)
[place] LEVEL OF CARE SCREENING TOOL     IDENTIFICATION  Patient Name: [name] Birthdate: 1927/08/06 Sex: female Admission Date (Current Location): 08/03/2018  [facility] and [place] Number:  Herbalist and Address:  [facility], [place] [address] [place], [place] [address]      Provider Number: [id number]  Attending Physician Name and Address:  Tracy Osgood, MD  Relative Name and Phone Number:  [name] [phone]    Current Level of Care: Hospital Recommended Level of Care: [place] Prior Approval Number:    Date Approved/Denied:   PASRR Number: [id number]  Discharge Plan: SNF    Current Diagnoses: Patient Active Problem List   Diagnosis Date Noted  . Depression 08/03/2018  . CAD (coronary artery disease) 08/03/2018  . Seizure ([place]) 08/03/2018  . Sepsis ([place]) 08/03/2018  . Lobar pneumonia ([place]) 08/03/2018  . Dementia 08/03/2018  . Closed fracture of distal end of right radius 06/02/2018  . Viral upper respiratory infection 02/07/2018  . Wheezing 12/31/2017  . Breast cancer ([place]) 11/25/2017  . CVA (cerebral vascular accident) ([place]) 11/25/2017  . GERD (gastroesophageal reflux disease) 11/25/2017  . Chronic obstructive pulmonary disease ([place]) 10/29/2017  . Ingrown nail of great toe of right foot 09/11/2017  . Paronychia of great toe, right 09/11/2017  . Dyslipidemia 06/11/2017  . Bleeding, intracranial ([place]) 06/11/2017  . Mastitis 03/02/2017  . MSSA (methicillin susceptible Staphylococcus aureus) infection 03/02/2017  . Open wound of right breast 03/02/2017  . Febrile illness, acute 02/25/2017  . Fall 02/22/2017  . Intraparenchymal hemorrhage of brain ([place]) 02/22/2017  . Leucocytosis 02/22/2017  . Physical debility 02/22/2017  . Anxiety 06/20/2016  . Glossodynia 06/20/2016  . Insomnia, unspecified 06/20/2016  . Partial symptomatic epilepsy with complex partial  seizures, not intractable, without status epilepticus ([place]) 06/20/2016  . Risk for falls 06/20/2016  . Takotsubo cardiomyopathy   . HTN (hypertension)     Orientation RESPIRATION BLADDER Height & Weight     Self, Time, Situation, Place  O2(nasal cannula (2L/min)) External catheter Weight: 134 lb (60.8 kg) Height:  4\' 11"  (149.9 cm)  BEHAVIORAL SYMPTOMS/MOOD NEUROLOGICAL BOWEL NUTRITION STATUS      Continent Diet(please see discharge summary)  AMBULATORY STATUS COMMUNICATION OF NEEDS Skin       Normal                       Personal Care Assistance Level of Assistance  Bathing, Feeding, Dressing Bathing Assistance: Maximum assistance Feeding assistance: Independent Dressing Assistance: Maximum assistance     Functional Limitations Info  Sight, Hearing, Speech Sight Info: Adequate Hearing Info: Adequate Speech Info: Adequate    SPECIAL CARE FACTORS FREQUENCY  PT (By licensed PT), OT (By licensed OT)     PT Frequency: 5 xper wk OT Frequency: 5 x per wk            Contractures Contractures Info: Not present    Additional Factors Info  Code Status, Allergies Code Status Info: Partial Code  Allergies Info: Penicillins,Sulfa Antibiotics, Codeine, Promethazine( can take pills not IV)Levaquin Levofloxacin           Current Medications (08/05/2018):  This is the current hospital active medication list Current Facility-Administered Medications  Medication Dose Route Frequency Provider Last Rate Last Dose  . 0.9 %  sodium chloride infusion   Intravenous Continuous Tracy Osgood, MD 10 mL/hr at 08/05/18 [phone]    . acetaminophen (  TYLENOL) tablet 650 mg  650 mg Oral Q6H PRN Ivor Costa, MD   650 mg at 08/04/18 0002  . acidophilus (RISAQUAD) capsule 1 capsule  1 capsule Oral Q breakfast Ivor Costa, MD   1 capsule at 08/05/18 1093  . albuterol (PROVENTIL) (2.5 MG/3ML) 0.083% nebulizer solution 2.5 mg  2.5 mg Nebulization Q4H PRN Ivor Costa, MD      . azithromycin  (ZITHROMAX) 500 mg in sodium chloride 0.9 % 250 mL IVPB  500 mg Intravenous Q24H Ivor Costa, MD   Stopped at 08/04/18 2302  . bethanechol (URECHOLINE) tablet 10 mg  10 mg Oral Q breakfast Ivor Costa, MD   10 mg at 08/05/18 2355  . carvedilol (COREG) tablet 3.125 mg  3.125 mg Oral BID WC Ivor Costa, MD   3.125 mg at 08/05/18 0923  . cefTRIAXone (ROCEPHIN) 1 g in sodium chloride 0.9 % 100 mL IVPB  1 g Intravenous Q24H Ivor Costa, MD   Stopped at 08/04/18 2050  . cholecalciferol (VITAMIN D) tablet 2,000 Units  2,000 Units Oral Daily Ivor Costa, MD   2,000 Units at 08/05/18 [phone]  . clonazePAM (KLONOPIN) tablet 0.5 mg  0.5 mg Oral BID WC Ivor Costa, MD   0.5 mg at 08/05/18 0930  . dextromethorphan-guaiFENesin (MUCINEX DM) 30-600 MG per 12 hr tablet 1 tablet  1 tablet Oral BID PRN Ivor Costa, MD      . donepezil (ARICEPT) tablet 5 mg  5 mg Oral Q supper Ivor Costa, MD   5 mg at 08/04/18 1603  . doxepin (SINEQUAN) 10 MG/ML solution 1.5 mg  1.5 mg Oral QHS Tracy Osgood, MD   1.5 mg at 08/04/18 2158  . enoxaparin (LOVENOX) injection 40 mg  40 mg Subcutaneous Q24H Ivor Costa, MD   40 mg at 08/04/18 2158  . estradiol (ESTRACE) vaginal cream 1 Applicatorful  1 Applicatorful Vaginal QHS PRN Ivor Costa, MD      . hydrALAZINE (APRESOLINE) injection 5 mg  5 mg Intravenous Q2H PRN Ivor Costa, MD      . hydrocortisone (ANUSOL-HC) 2.5 % rectal cream 1 application  1 application Rectal BID PRN Ivor Costa, MD      . ipratropium-albuterol (DUONEB) 0.5-2.5 (3) MG/3ML nebulizer solution 3 mL  3 mL Nebulization TID Ivor Costa, MD   Stopped at 08/05/18 1436  . levETIRAcetam (KEPPRA) tablet 750 mg  750 mg Oral BID WC Ivor Costa, MD   750 mg at 08/05/18 0254  . loratadine (CLARITIN) tablet 10 mg  10 mg Oral Daily PRN Ivor Costa, MD      . LORazepam (ATIVAN) injection 1 mg  1 mg Intravenous Q2H PRN Ivor Costa, MD      . memantine [facility]) tablet 5 mg  5 mg Oral Q supper Ivor Costa, MD   5 mg at 08/04/18 1905  .  mometasone-formoterol (DULERA) 100-5 MCG/ACT inhaler 2 puff  2 puff Inhalation BID Ivor Costa, MD   2 puff at 08/05/18 0859  . montelukast (SINGULAIR) chewable tablet 5 mg  5 mg Oral Q supper Ivor Costa, MD   5 mg at 08/04/18 1627  . multivitamin with minerals tablet 1 tablet  1 tablet Oral Q supper Ivor Costa, MD   1 tablet at 08/04/18 1603  . omega-3 acid ethyl esters (LOVAZA) capsule 1 g  1 g Oral Daily Ivor Costa, MD   1 g at 08/05/18 2706  . ondansetron (ZOFRAN) injection 4 mg  4 mg Intravenous Q8H PRN Ivor Costa,  MD      . pantoprazole (PROTONIX) EC tablet 40 mg  40 mg Oral Daily Ivor Costa, MD   40 mg at 08/05/18 0981  . PARoxetine (PAXIL) tablet 10 mg  10 mg Oral Q1400 Ivor Costa, MD   10 mg at 08/05/18 1417  . polyvinyl alcohol (LIQUIFILM TEARS) 1.4 % ophthalmic solution 1 drop  1 drop Both Eyes PRN Ivor Costa, MD      . pravastatin (PRAVACHOL) tablet 20 mg  20 mg Oral Q supper Ivor Costa, MD   20 mg at 08/04/18 1629  . pregabalin (LYRICA) capsule 50 mg  50 mg Oral BID WC Ivor Costa, MD   50 mg at 08/05/18 0931  . zolpidem (AMBIEN) tablet 5 mg  5 mg Oral QHS PRN Ivor Costa, MD         Discharge Medications: Please see discharge summary for a list of discharge medications.  Relevant Imaging Results:  Relevant Lab Results:   Additional Information SSN# [id number]  Vinie Sill, LCSWA

## 2018-08-05 NOTE — Evaluation (Signed)
Physical Therapy Evaluation Patient Details Name: [name] MRN: [id number] DOB: 08/19/1927 Today's Date: 08/05/2018   History of Present Illness  Pt is a 84 y.o. female with medical history significant of dementia, hypertension, hyperlipidemia, COPD, anxiety, CAD, GERD, Takotsubo cardiomyopathy, seizure, ICH, breast cancer (right lumpectomy, no radiation and chemotherapy), IBS, who presents with cough, shortness of breath, generalized weakness, fever and chills.Chest x-ray showed left perihilar infiltration. Treatment for sepsis secondary to lobar pneumonia.   Clinical Impression  PTA pt living with daughter and experiencing increasing difficulty with ambulation with RW in the last few weeks. Pt requires assist for iADLs. Pt is currently limited in safe mobility by decreased strength, balance and endurance. Pt is currently mod A for transfers and unable to attempt gait due to decreased strength and balance. With marching in place pt only able to lift L LE x2 and R LE x1 prior to having to sit down due to fatigue. PT recommends SNF level rehab at discharge. Family prefers [facility] in [place] if possible. PT will continue to follow acutely.     Follow Up Recommendations SNF    Equipment Recommendations  Other (comment)(TBD at next venue)    Recommendations for Other Services       Precautions / Restrictions Precautions Precautions: Fall Restrictions Weight Bearing Restrictions: No      Mobility  Bed Mobility Overal bed mobility: Needs Assistance Bed Mobility: Supine to Sit     Supine to sit: Mod assist     General bed mobility comments: OOB in recliner   Transfers Overall transfer level: Needs assistance Equipment used: Rolling walker (2 wheeled) Transfers: Sit to/from Stand Sit to Stand: Mod assist Stand pivot transfers: Mod assist       General transfer comment: modA for power up and steadying at RW, requires vc for hand placement for power up    Ambulation/Gait             General Gait Details: with marching in place pt only able to lift L LEx2 and R LEx1      Balance Overall balance assessment: Needs assistance Sitting-balance support: Feet supported;Bilateral upper extremity supported Sitting balance-Leahy Scale: Poor     Standing balance support: Bilateral upper extremity supported Standing balance-Leahy Scale: Zero Standing balance comment: requires B UE and therapist support to maintain balance                             Pertinent Vitals/Pain Pain Assessment: No/denies pain    Home Living Family/patient expects to be discharged to:: Skilled nursing facility Living Arrangements: Children Available Help at Discharge: Available 24 hours/day Type of Home: House Home Access: Stairs to enter Entrance Stairs-Rails: Psychiatric nurse of Steps: 4 Home Layout: One level Home Equipment: Walker - 2 wheels;Walker - 4 wheels;Shower seat;Grab bars - tub/shower;Wheelchair - manual      Prior Function Level of Independence: Independent with assistive device(s)               Hand Dominance   Dominant Hand: Right    Extremity/Trunk Assessment   Upper Extremity Assessment Upper Extremity Assessment: Defer to OT evaluation    Lower Extremity Assessment Lower Extremity Assessment: Generalized weakness(RLE>LLE strength, ROM WFL)       Communication   Communication: HOH  Cognition Arousal/Alertness: Awake/alert Behavior During Therapy: WFL for tasks assessed/performed Overall Cognitive Status: Within Functional Limits for tasks assessed  General Comments General comments (skin integrity, edema, etc.): Daughter present throughout session, Pt on 2L O2 via [place], SaO2 93%O2, VSS        Assessment/Plan    PT Assessment Patient needs continued PT services  PT Problem List Decreased strength;Decreased activity  tolerance;Decreased balance;Decreased mobility;Decreased coordination;Decreased cognition;Decreased knowledge of use of DME;Decreased safety awareness       PT Treatment Interventions DME instruction;Gait training;Functional mobility training;Therapeutic activities;Therapeutic exercise;Balance training;Cognitive remediation;Patient/family education    PT Goals (Current goals can be found in the Care Plan section)  Acute Rehab PT Goals Patient Stated Goal: go home(GO HOME, PATIENT DTR WANTS HER TO GO TO [facility] FOR REHAB.) PT Goal Formulation: With patient Time For Goal Achievement: 08/19/18 Potential to Achieve Goals: Fair    Frequency Min 2X/week    AM-PAC PT "6 Clicks" Daily Activity  Outcome Measure Difficulty turning over in bed (including adjusting bedclothes, sheets and blankets)?: Unable Difficulty moving from lying on back to sitting on the side of the bed? : Unable Difficulty sitting down on and standing up from a chair with arms (e.g., wheelchair, bedside commode, etc,.)?: Unable Help needed moving to and from a bed to chair (including a wheelchair)?: A Lot Help needed walking in hospital room?: Total Help needed climbing 3-5 steps with a railing? : Total 6 Click Score: 7    End of Session Equipment Utilized During Treatment: Gait belt;Oxygen Activity Tolerance: Patient tolerated treatment well Patient left: in chair;with call bell/phone within reach;with chair alarm set;with family/visitor present Nurse Communication: Mobility status PT Visit Diagnosis: Unsteadiness on feet (R26.81);Other abnormalities of gait and mobility (R26.89);Repeated falls (R29.6);Muscle weakness (generalized) (M62.81);History of falling (Z91.81);Difficulty in walking, not elsewhere classified (R26.2)    Time: 3149-7026 PT Time Calculation (min) (ACUTE ONLY): 29 min   Charges:   PT Evaluation $PT Eval Moderate Complexity: 1 Mod PT Treatments $Therapeutic Activity: 8-22 mins         Mickeal Daws B. Migdalia Dk PT, DPT Acute Rehabilitation Services Pager [phone] Office [phone]   [place] 08/05/2018, 12:53 PM

## 2018-08-05 NOTE — Clinical Social Work Note (Signed)
Clinical Social Work Assessment  Patient Details  Name: [name] MRN: [id number] Date of Birth: Jul 09, 1927  Date of referral:  08/05/18               Reason for consult:  Facility Placement                Permission sought to share information with:  Facility Sport and exercise psychologist, Family Supports Permission granted to share information::  Yes, Verbal Permission Granted  Name::     Acupuncturist  Agency::  SNFs  Relationship::  daughter  Contact Information:  [phone]  Housing/Transportation Living arrangements for the past 2 months:  Single Family Home Source of Information:  Adult Children Patient Interpreter Needed:  None Criminal Activity/Legal Involvement Pertinent to Current Situation/Hospitalization:  No - Comment as needed Significant Relationships:  Adult Children Lives with:  Self, Other (Comment)(with family as needed) Do you feel safe going back to the place where you live?  No Need for family participation in patient care:  Yes (Comment)  Care giving concerns:  CSW received consult for discharge needs. CSW spoke with patient's daughter regarding PT recommendation of SNF placement at time of discharge. Patient's daughter states her mother lives alone but is staying with her currently until she is able to return safely home.    Social Worker assessment / plan:  CSW spoke with patient's daughter  concerning possibility of rehab at [facility] before returning home.   Employment status:  Retired Forensic scientist:  Medicare PT Recommendations:  [place] / Referral to community resources:  [place]  Patient/Family's Response to care:  Patient's family recognizes need for rehab before returning home and is agreeable to a SNF  Placement. Patient reported preference for [facility] in [place].  Patient/Family's Understanding of and Emotional Response to Diagnosis, Current Treatment, and Prognosis:  Family is  realistic regarding therapy needs and expressed being hopeful for SNF placement. Patient's family expressed understanding of CSW role and discharge process as well as medical condition. No questions/concerns about plan or treatment.   Emotional Assessment Appearance:  Appears stated age Attitude/Demeanor/Rapport:    Affect (typically observed):  Unable to Assess(patient was sleep) Orientation:  Oriented to Self, Oriented to Place, Oriented to  Time Alcohol / Substance use:  Not Applicable Psych involvement (Current and /or in the community):  No (Comment)  Discharge Needs  Concerns to be addressed:  Care Coordination Readmission within the last 30 days:  No Current discharge risk:  Dependent with Mobility Barriers to Discharge:  Continued Medical Work up   [employer], [place] 08/05/2018, 5:38 PM

## 2018-08-05 NOTE — Evaluation (Signed)
Occupational Therapy Evaluation Patient Details Name: [name] MRN: [id number] DOB: 05-18-1927 Today's Date: 08/05/2018    History of Present Illness Pt is a 84 y.o. female with medical history significant of dementia, hypertension, hyperlipidemia, COPD, anxiety, CAD, GERD, Takotsubo cardiomyopathy, seizure, ICH, breast cancer (right lumpectomy, no radiation and chemotherapy), IBS, who presents with cough, shortness of breath, generalized weakness, fever and chills.Chest x-ray showed left perihilar infiltration. Treatment for sepsis secondary to lobar pneumonia.    Clinical Impression   PATIENT HAS DECREASED I AND SAFETY WITH PERFORMING ADLS AND MOBILITY.  PATIENT DTR WHO IS HER PRIMARY CAREGIVER IS CONCERNED ABOUT HER RETURNING HOME SECONDARY TO THE LEVEL OF CARE SHE IS REQUIRING. PATIENT DTR HAS BACK PROBLEMS AND CAN NOT LIFT PATIENT. PATIENT DTR WANTS ST SNF AT [facility] FOR REHAB. PATIENT WOULD BENEFIT FROM REHAB STAY TO RETURN TO BASELINE PERFORMANCE AND HOME WITH DTR. ACUTE OT TO FOLLOW.    Follow Up Recommendations  SNF    Equipment Recommendations  None recommended by OT    Recommendations for Other Services       Precautions / Restrictions Restrictions Weight Bearing Restrictions: No      Mobility Bed Mobility Overal bed mobility: Needs Assistance Bed Mobility: Supine to Sit     Supine to sit: Mod assist        Transfers Overall transfer level: Needs assistance   Transfers: Sit to/from Stand;Stand Pivot Transfers Sit to Stand: Mod assist Stand pivot transfers: Mod assist       General transfer comment: PATIENT REQUIRED CUES FOR PROPER HAND PLACEMENT AND TO TURN FEET    Balance                                           ADL either performed or assessed with clinical judgement   ADL Overall ADL's : Needs assistance/impaired Eating/Feeding: Independent   Grooming: Wash/dry hands;Wash/dry face;Set up   Upper Body Bathing: Minimal  assistance;Sitting   Lower Body Bathing: Maximal assistance;Sit to/from stand   Upper Body Dressing : Minimal assistance;Sitting   Lower Body Dressing: Total assistance;+2 for safety/equipment;Sit to/from stand   Toilet Transfer: Moderate assistance;+2 for safety/equipment;Stand-pivot   Toileting- Clothing Manipulation and Hygiene: Total assistance;+2 for safety/equipment;Sit to/from stand       Functional mobility during ADLs: Moderate assistance;+2 for safety/equipment General ADL Comments: PATIENT IS REQUIRING ADDITIONAL ASSIST THAN SHE WAS REQUIRING AT HOME.      Vision Baseline Vision/History: Wears glasses;Macular Degeneration Wears Glasses: At all times Patient Visual Report: No change from baseline       Perception     Praxis      Pertinent Vitals/Pain Pain Assessment: No/denies pain     Hand Dominance Right   Extremity/Trunk Assessment Upper Extremity Assessment Upper Extremity Assessment: Generalized weakness           Communication Communication Communication: HOH   Cognition Arousal/Alertness: Awake/alert Behavior During Therapy: WFL for tasks assessed/performed Overall Cognitive Status: Within Functional Limits for tasks assessed                                     General Comments       Exercises     Shoulder Instructions      Home Living Family/patient expects to be discharged to:: Skilled nursing facility Living Arrangements: Children  Available Help at Discharge: Available 24 hours/day Type of Home: House Home Access: Stairs to enter [employer] of Steps: 4 Entrance Stairs-Rails: Right;Left Home Layout: One level     Bathroom Shower/Tub: Teacher, early years/pre: Standard     Home Equipment: Environmental consultant - 2 wheels;Walker - 4 wheels;Shower seat;Grab bars - tub/shower;Wheelchair - manual          Prior Functioning/Environment Level of Independence: Independent with assistive device(s)                  OT Problem List: Decreased strength;Decreased activity tolerance;Decreased knowledge of use of DME or AE      OT Treatment/Interventions: Self-care/ADL training;DME and/or AE instruction;Therapeutic activities    OT Goals(Current goals can be found in the care plan section) Acute Rehab OT Goals Patient Stated Goal: (GO HOME, PATIENT DTR WANTS HER TO GO TO [facility] FOR REHAB.) OT Goal Formulation: With patient Time For Goal Achievement: 08/19/18 Potential to Achieve Goals: Good  OT Frequency: Min 2X/week   Barriers to D/C: Decreased caregiver support          Co-evaluation              AM-PAC PT "6 Clicks" Daily Activity     Outcome Measure Help from another person eating meals?: None Help from another person taking care of personal grooming?: A Little Help from another person toileting, which includes using toliet, bedpan, or urinal?: A Lot Help from another person bathing (including washing, rinsing, drying)?: A Lot Help from another person to put on and taking off regular upper body clothing?: A Little Help from another person to put on and taking off regular lower body clothing?: Total 6 Click Score: 15   End of Session Equipment Utilized During Treatment: Oxygen Nurse Communication: (OK WORKING WITH THEARPY)  Activity Tolerance: Patient tolerated treatment well Patient left: in chair;with call bell/phone within reach;with nursing/sitter in room;with family/visitor present  OT Visit Diagnosis: Unsteadiness on feet (R26.81);Muscle weakness (generalized) (M62.81);History of falling (Z91.81)                Time: 9323-5573 OT Time Calculation (min): 47 min Charges:  OT General Charges $OT Visit: 1 Visit OT Evaluation $OT Eval Low Complexity: 1 Low OT Treatments $Self Care/Home Management : 2-20 mins  6 CLICKS  Ritvik Mczeal 08/05/2018, 11:00 AM

## 2018-08-06 LAB — BASIC METABOLIC PANEL
ANION GAP: 10 (ref 5–15)
BUN: 8 mg/dL (ref 8–23)
CHLORIDE: 107 mmol/L (ref 98–111)
CO2: 23 mmol/L (ref 22–32)
Calcium: 8.8 mg/dL — ABNORMAL LOW (ref 8.9–10.3)
Creatinine, Ser: 0.73 mg/dL (ref 0.44–1.00)
GFR calc Af Amer: >60 mL/min (ref >60)
GFR calc non Af Amer: >60 mL/min (ref >60)
GLUCOSE: 100 mg/dL — AB (ref 70–99)
Potassium: 3.6 mmol/L (ref 3.5–5.1)
Sodium: 140 mmol/L (ref 135–145)

## 2018-08-06 LAB — CBC
HEMATOCRIT: 35.4 % — AB (ref 36.0–46.0)
Hemoglobin: 12 g/dL (ref 12.0–15.0)
MCH: 30.4 pg (ref 26.0–34.0)
MCHC: 33.9 g/dL (ref 30.0–36.0)
MCV: 89.6 fL (ref 78.0–100.0)
Platelets: 189 10*3/uL (ref 150–400)
RBC: 3.95 MIL/uL (ref 3.87–5.11)
RDW: 12 % (ref 11.5–15.5)
WBC: 7.1 10*3/uL (ref 4.0–10.5)

## 2018-08-06 LAB — LEVETIRACETAM LEVEL: LEVETIRACETAM: 42 ug/mL — AB (ref 10.0–40.0)

## 2018-08-06 MED ORDER — CEFDINIR 300 MG PO CAPS
300.0000 mg | ORAL_CAPSULE | Freq: Two times a day (BID) | ORAL | Status: DC
  Administered 2018-08-06 – 2018-08-09 (×7): 300 mg via ORAL
  Filled 2018-08-06 (×7): qty 1

## 2018-08-06 MED ORDER — AZITHROMYCIN 250 MG PO TABS
500.0000 mg | ORAL_TABLET | Freq: Every day | ORAL | Status: DC
  Administered 2018-08-06 – 2018-08-09 (×4): 500 mg via ORAL
  Filled 2018-08-06 (×4): qty 2

## 2018-08-06 MED ORDER — CARVEDILOL 6.25 MG PO TABS
6.2500 mg | ORAL_TABLET | Freq: Two times a day (BID) | ORAL | Status: DC
  Administered 2018-08-06 – 2018-08-07 (×3): 6.25 mg via ORAL
  Filled 2018-08-06 (×4): qty 1

## 2018-08-06 NOTE — Progress Notes (Signed)
PROGRESS NOTE        PATIENT DETAILS Name: [name] Age: 84 y.o. Sex: female Date of Birth: [birth year] Admit Date: 08/03/2018 Admitting Physician Ivor Costa, MD EUM:PNTIRWE, Audree Camel, MD  Brief Narrative: Patient is a 84 y.o. female with prior history of COPD, tachycardia Subu cardiomyopathy, seizure disorder, anxiety, GERD-who presented with 4-5-day history of worsening shortness of breath, cough,subjective fever and generalized weakness.  She was thought to have pneumonia and admitted to the hospitalist service.  See below for further details.  Subjective: Still weak.  No nausea no vomiting.  No fever no chills.  Cough is still present.  Breathing is about the same as yesterday.  No diarrhea.  Assessment/Plan: Sepsis secondary to lobar pneumonia: Sepsis pathophysiology has improved, blood cultures negative, respiratory virus panel negative-she appears significantly weak and deconditioned-improved-she is not yet back to baseline.  Treated with Rocephin and Zithromax.  Switch to p.o.  Suspect will need SNF on discharge.  Mobilize with PT-out of bed to chair.    COPD: Stable-not wheezing-continue bronchodilators.   History of takatsubo cardiomyopathy: Volume status stable-stop all IV fluids.  Recent EF on TTE (9/16) was 60-65%.    Hypertension: BP controlled-continue Coreg-dose increased.  Holding other antihypertensive medications.  Seizure disorder: Stable-continue with  Keppra  Anxiety/depression: Appears stable-continue Klonopin, doxepin and Paxil.    CAD: No anginal symptoms-appears stable.  Dyslipidemia: Continue statin  GERD: Continue Protonix  Dementia: Slow but awake and alert-suspect close to usual baseline-continue Namenda and Aricept.  Acute on chronic debility/weakness: Worsening generalized weakness secondary to acute illness-awaiting PT/OT evaluation-suspect will require SNF.  At baseline she walks around the house with the help of  a walker.    DVT Prophylaxis: Prophylactic Lovenox   Code Status: DNI  Family Communication: Daughter at bedside  Disposition Plan: Remain inpatient-SNF later this week  Antimicrobial agents: Anti-infectives (From admission, onward)   Start     Dose/Rate Route Frequency Ordered Stop   08/06/18 2200  azithromycin (ZITHROMAX) tablet 500 mg     500 mg Oral Daily 08/06/18 1029     08/06/18 1200  cefdinir (OMNICEF) capsule 300 mg     300 mg Oral Every 12 hours 08/06/18 1029     08/03/18 2100  azithromycin (ZITHROMAX) 500 mg in sodium chloride 0.9 % 250 mL IVPB  Status:  Discontinued     500 mg 250 mL/hr over 60 Minutes Intravenous Every 24 hours 08/03/18 1956 08/06/18 1029   08/03/18 2030  cefTRIAXone (ROCEPHIN) 1 g in sodium chloride 0.9 % 100 mL IVPB  Status:  Discontinued     1 g 200 mL/hr over 30 Minutes Intravenous Every 24 hours 08/03/18 1956 08/06/18 1029   08/03/18 1530  ceFEPIme (MAXIPIME) 2 g in sodium chloride 0.9 % 100 mL IVPB     2 g 200 mL/hr over 30 Minutes Intravenous  Once 08/03/18 1522 08/03/18 1649   08/03/18 1530  metroNIDAZOLE (FLAGYL) IVPB 500 mg  Status:  Discontinued     500 mg 100 mL/hr over 60 Minutes Intravenous Every 8 hours 08/03/18 1522 08/03/18 1947   08/03/18 1530  vancomycin (VANCOCIN) IVPB 1000 mg/200 mL premix     1,000 mg 200 mL/hr over 60 Minutes Intravenous  Once 08/03/18 1522 08/03/18 1856      Procedures: None  CONSULTS:  None  Time spent: 25- minutes-Greater than  50% of this time was spent in counseling, explanation of diagnosis, planning of further management, and coordination of care.  MEDICATIONS: Scheduled Meds: . acidophilus  1 capsule Oral Q breakfast  . azithromycin  500 mg Oral Daily  . bethanechol  10 mg Oral Q breakfast  . carvedilol  6.25 mg Oral BID WC  . cefdinir  300 mg Oral Q12H  . cholecalciferol  2,000 Units Oral Daily  . clonazePAM  0.5 mg Oral BID WC  . donepezil  5 mg Oral Q supper  . doxepin  1.5 mg  Oral QHS  . enoxaparin (LOVENOX) injection  40 mg Subcutaneous Q24H  . ipratropium-albuterol  3 mL Nebulization TID  . levETIRAcetam  750 mg Oral BID WC  . memantine  5 mg Oral Q supper  . mometasone-formoterol  2 puff Inhalation BID  . montelukast  5 mg Oral Q supper  . multivitamin with minerals  1 tablet Oral Q supper  . omega-3 acid ethyl esters  1 g Oral Daily  . pantoprazole  40 mg Oral Daily  . PARoxetine  10 mg Oral Q1400  . pravastatin  20 mg Oral Q supper  . pregabalin  50 mg Oral BID WC   Continuous Infusions: . sodium chloride 10 mL/hr at 08/05/18 0642   PRN Meds:.acetaminophen, albuterol, dextromethorphan-guaiFENesin, estradiol, hydrALAZINE, hydrocortisone, loratadine, LORazepam, ondansetron (ZOFRAN) IV, polyvinyl alcohol, zolpidem   PHYSICAL EXAM: Vital signs: Vitals:   08/06/18 0759 08/06/18 0837 08/06/18 1450 08/06/18 1757  BP: (!) 170/80   (!) 170/77  Pulse: 89  93 89  Resp:   18   Temp: 97.6 F (36.4 C)     TempSrc: Oral     SpO2: 93% 95% 96%   Weight:      Height:       Filed Weights   08/03/18 1322  Weight: 60.8 kg   Body mass index is 27.06 kg/m.   General appearance:Awake, alert, not in any distress.  Looks weak Eyes:no scleral icterus. HEENT: Atraumatic and Normocephalic Neck: supple, no JVD. Resp:Good air entry bilaterally,no rales or rhonchi CVS: S1 S2 regular GI: Bowel sounds present, Non tender and not distended with no gaurding, rigidity or rebound. Extremities: B/L Lower Ext shows no edema, both legs are warm to touch Neurology:  Non focal Musculoskeletal:No digital cyanosis Skin:No Rash, warm and dry Wounds:N/A  I have personally reviewed following labs and imaging studies  LABORATORY DATA: CBC: Recent Labs  Lab 08/03/18 1334 08/05/18 0404 08/06/18 0442  WBC 13.9* 8.8 7.1  HGB 15.7* 11.4* 12.0  HCT 47.0* 34.5* 35.4*  MCV 90.6 90.6 89.6  PLT 171 152 220    Basic Metabolic Panel: Recent Labs  Lab 08/03/18 1334  08/05/18 0404 08/06/18 0442  NA 137 137 140  K 4.1 3.5 3.6  CL 103 109 107  CO2 24 20* 23  GLUCOSE 110* 98 100*  BUN 11 10 8   CREATININE 0.96 0.81 0.73  CALCIUM 9.6 8.4* 8.8*    GFR: Estimated Creatinine Clearance: 38.5 mL/min (by C-G formula based on SCr of 0.73 mg/dL).  Liver Function Tests: No results for input(s): AST, ALT, ALKPHOS, BILITOT, PROT, ALBUMIN in the last 168 hours. No results for input(s): LIPASE, AMYLASE in the last 168 hours. No results for input(s): AMMONIA in the last 168 hours.  Coagulation Profile: No results for input(s): INR, PROTIME in the last 168 hours.  Cardiac Enzymes: No results for input(s): CKTOTAL, CKMB, CKMBINDEX, TROPONINI in the last 168 hours.  BNP (last  3 results) No results for input(s): PROBNP in the last 8760 hours.  HbA1C: No results for input(s): HGBA1C in the last 72 hours.  CBG: Recent Labs  Lab 08/03/18 1615  GLUCAP 110*    Lipid Profile: No results for input(s): CHOL, HDL, LDLCALC, TRIG, CHOLHDL, LDLDIRECT in the last 72 hours.  Thyroid Function Tests: No results for input(s): TSH, T4TOTAL, FREET4, T3FREE, THYROIDAB in the last 72 hours.  Anemia Panel: No results for input(s): VITAMINB12, FOLATE, FERRITIN, TIBC, IRON, RETICCTPCT in the last 72 hours.  Urine analysis:    Component Value Date/Time   COLORURINE YELLOW 08/03/2018 [place] 08/03/2018 1650   LABSPEC 1.015 08/03/2018 1650   PHURINE 7.0 08/03/2018 1650   GLUCOSEU NEGATIVE 08/03/2018 1650   HGBUR NEGATIVE 08/03/2018 1650   BILIRUBINUR NEGATIVE 08/03/2018 1650   KETONESUR 20 (A) 08/03/2018 1650   PROTEINUR 30 (A) 08/03/2018 1650   NITRITE NEGATIVE 08/03/2018 1650   LEUKOCYTESUR NEGATIVE 08/03/2018 1650    Sepsis Labs: Lactic Acid, Venous    Component Value Date/Time   LATICACIDVEN 1.6 08/03/2018 2111    MICROBIOLOGY: Recent Results (from the past 240 hour(s))  Culture, blood (Routine X 2) w Reflex to ID Panel     Status:  None (Preliminary result)   Collection Time: 08/03/18  1:35 PM  Result Value Ref Range Status   Specimen Description BLOOD LEFT ANTECUBITAL  Final   Special Requests   Final    BOTTLES DRAWN AEROBIC AND ANAEROBIC Blood Culture adequate volume   Culture   Final    NO GROWTH 3 DAYS Performed at [facility], [place] [address] [place], [place] [address]    Report Status PENDING  Incomplete  Culture, blood (Routine X 2) w Reflex to ID Panel     Status: None (Preliminary result)   Collection Time: 08/03/18  2:45 PM  Result Value Ref Range Status   Specimen Description BLOOD LEFT WRIST  Final   Special Requests   Final    BOTTLES DRAWN AEROBIC AND ANAEROBIC Blood Culture adequate volume   Culture   Final    NO GROWTH 3 DAYS Performed at [facility], [place] [address], [place], [place] [address]    Report Status PENDING  Incomplete  Respiratory Panel by PCR     Status: None   Collection Time: 08/03/18  7:54 PM  Result Value Ref Range Status   Adenovirus NOT DETECTED NOT DETECTED Final   Coronavirus 229E NOT DETECTED NOT DETECTED Final   Coronavirus HKU1 NOT DETECTED NOT DETECTED Final   Coronavirus NL63 NOT DETECTED NOT DETECTED Final   Coronavirus OC43 NOT DETECTED NOT DETECTED Final   Metapneumovirus NOT DETECTED NOT DETECTED Final   Rhinovirus / Enterovirus NOT DETECTED NOT DETECTED Final   Influenza A NOT DETECTED NOT DETECTED Final   Influenza A H1 NOT DETECTED NOT DETECTED Final   Influenza A H1 2009 NOT DETECTED NOT DETECTED Final   Influenza A H3 NOT DETECTED NOT DETECTED Final   Influenza B NOT DETECTED NOT DETECTED Final   Parainfluenza Virus 1 NOT DETECTED NOT DETECTED Final   Parainfluenza Virus 2 NOT DETECTED NOT DETECTED Final   Parainfluenza Virus 3 NOT DETECTED NOT DETECTED Final   Parainfluenza Virus 4 NOT DETECTED NOT DETECTED Final   Respiratory Syncytial Virus NOT DETECTED NOT DETECTED Final   Bordetella pertussis NOT DETECTED NOT DETECTED Final    Chlamydophila pneumoniae NOT DETECTED NOT DETECTED Final   Mycoplasma pneumoniae NOT  DETECTED NOT DETECTED Final    Comment: Performed at [facility], [place] [address], [place], [place] [address]    RADIOLOGY STUDIES/RESULTS: Dg Chest 2 View  Result Date: 08/03/2018 CLINICAL DATA:  Fever with weakness and shortness of breath. EXAM: CHEST - 2 VIEW COMPARISON:  03/28/2018 and 12/31/2017 radiographs. FINDINGS: The heart size and mediastinal contours are stable allowing for positional differences. There is aortic atherosclerosis. There is new left perihilar opacity on the frontal examination, likely representing pneumonia in this clinical context. The right lung is clear. There is no pleural effusion or pneumothorax. Vertebral body anomaly and focal kyphosis near the thoracolumbar junction are again noted. IMPRESSION: New left perihilar opacity most consistent with pneumonia in this clinical context. Followup PA and lateral chest X-ray is recommended in 3-4 weeks following trial of antibiotic therapy to ensure resolution and exclude underlying malignancy. Electronically Signed   By: Richardean Sale M.D.   On: 08/03/2018 16:01   Ct Head Wo Contrast  Result Date: 08/03/2018 CLINICAL DATA:  Uncontrolled tremors over the last 2 days. Decreased responsiveness. History of hypertension and seizures. EXAM: CT HEAD WITHOUT CONTRAST TECHNIQUE: Contiguous axial images were obtained from the base of the skull through the vertex without intravenous contrast. COMPARISON:  CT head 03/09/2017 and 02/27/2017. FINDINGS: Brain: There is no evidence of acute intracranial hemorrhage, mass lesion, brain edema or extra-axial fluid collection. There is stable mild atrophy with mild ventricular dilatation. There is extensive confluent low-density in the periventricular and subcortical white matter, similar to previous study. There is no CT evidence of acute cortical infarction. Vascular: Intracranial vascular calcifications. No  hyperdense vessel identified. Skull: The calvarium appears demineralized, especially the clivus. No focal marrow lesion or acute osseous findings. Sinuses/Orbits: Stable chronic partial opacification of the left sphenoid sinus and right mastoid air cells. No air-fluid levels. Previous bilateral lens surgery. Other: None. IMPRESSION: 1. No acute intracranial findings demonstrated. 2. Stable extensive periventricular white matter disease, calvarial demineralization and chronic sinus disease. Electronically Signed   By: Richardean Sale M.D.   On: 08/03/2018 17:32     LOS: 2 days   Berle Mull, MD  [facility]  If 7PM-7AM, please contact night-coverage  Please page via [email or web address]-click on MD name and type text message  08/06/2018, 7:27 PM

## 2018-08-07 ENCOUNTER — Inpatient Hospital Stay (HOSPITAL_COMMUNITY): Payer: Medicare Other

## 2018-08-07 LAB — BLOOD GAS, ARTERIAL
Acid-Base Excess: 1.2 mmol/L (ref 0.0–2.0)
Bicarbonate: 24.8 mmol/L (ref 20.0–28.0)
DRAWN BY: 277361
O2 CONTENT: 2 L/min
O2 Saturation: 97.6 %
PATIENT TEMPERATURE: 98.6
pCO2 arterial: 36.7 mmHg (ref 32.0–48.0)
pH, Arterial: 7.445 (ref 7.350–7.450)
pO2, Arterial: 92.8 mmHg (ref 83.0–108.0)

## 2018-08-07 LAB — BASIC METABOLIC PANEL
Anion gap: 8 (ref 5–15)
BUN: 6 mg/dL — AB (ref 8–23)
CALCIUM: 9 mg/dL (ref 8.9–10.3)
CO2: 24 mmol/L (ref 22–32)
CREATININE: 0.72 mg/dL (ref 0.44–1.00)
Chloride: 106 mmol/L (ref 98–111)
GFR calc Af Amer: >60 mL/min (ref >60)
GLUCOSE: 102 mg/dL — AB (ref 70–99)
Potassium: 3.8 mmol/L (ref 3.5–5.1)
Sodium: 138 mmol/L (ref 135–145)

## 2018-08-07 LAB — LEGIONELLA PNEUMOPHILA SEROGP 1 UR AG: L. PNEUMOPHILA SEROGP 1 UR AG: NEGATIVE

## 2018-08-07 LAB — MAGNESIUM: Magnesium: 1.8 mg/dL (ref 1.7–2.4)

## 2018-08-07 MED ORDER — HYDROCORTISONE 1 % EX CREA
TOPICAL_CREAM | Freq: Two times a day (BID) | CUTANEOUS | Status: DC
  Administered 2018-08-07 – 2018-08-09 (×5): via TOPICAL
  Filled 2018-08-07: qty 28

## 2018-08-07 MED ORDER — IRBESARTAN 150 MG PO TABS
150.0000 mg | ORAL_TABLET | Freq: Every day | ORAL | Status: DC
  Administered 2018-08-07 – 2018-08-09 (×3): 150 mg via ORAL
  Filled 2018-08-07 (×3): qty 1

## 2018-08-07 MED ORDER — HYDROCORTISONE 0.5 % EX CREA
TOPICAL_CREAM | Freq: Two times a day (BID) | CUTANEOUS | Status: DC
  Filled 2018-08-07: qty 28.35

## 2018-08-07 MED ORDER — DOXEPIN HCL 10 MG/ML PO CONC
1.5000 mg | Freq: Every evening | ORAL | Status: DC | PRN
  Filled 2018-08-07: qty 0.2

## 2018-08-07 MED ORDER — HYDRALAZINE HCL 20 MG/ML IJ SOLN: 10.0000 mg | INTRAMUSCULAR | Status: DC | PRN

## 2018-08-07 NOTE — Care Management Important Message (Signed)
Important Message  Patient Details  Name: [name] MRN: [id number] Date of Birth: 01-02-1927   Medicare Important Message Given:  Yes    Abdulmalik Darco 08/07/2018, 2:04 PM

## 2018-08-07 NOTE — Progress Notes (Signed)
PROGRESS NOTE        PATIENT DETAILS Name: [name] Age: 84 y.o. Sex: female Date of Birth: 02-12-1927 Admit Date: 08/03/2018 Admitting Physician Ivor Costa, MD VQQ:[id number], Audree Camel, MD  Brief Narrative: Patient is a 84 y.o. female with prior history of COPD, tachycardia Subu cardiomyopathy, seizure disorder, anxiety, GERD-who presented with 4-5-day history of worsening shortness of breath, cough,subjective fever and generalized weakness.  She was thought to have pneumonia and admitted to the hospitalist service.  See below for further details.  Subjective: Patient is more lethargic than her baseline.  No nausea no vomiting no fever no chills.  Assessment/Plan: Sepsis secondary to lobar pneumonia: Sepsis pathophysiology has improved, blood cultures negative, respiratory virus panel negative-she appears significantly weak and deconditioned-improved-she is not yet back to baseline.  Treated with Rocephin and Zithromax.  Switch to p.o.  Suspect will need SNF on discharge.  Mobilize with PT-out of bed to chair.  Aspiration precaution, must be up for meals.  COPD: Stable-not wheezing-continue bronchodilators.   History of takatsubo cardiomyopathy: Volume status stable-stop all IV fluids.  Recent EF on TTE (9/16) was 60-65%.    Hypertension: BP controlled-continue Coreg-dose increased.  Holding other antihypertensive medications.  Seizure disorder: Stable-continue with  Keppra  Anxiety/depression: Appears stable-continue Klonopin, hold doxepin and Paxil.    CAD: No anginal symptoms-appears stable.  Dyslipidemia: Continue statin  GERD: Continue Protonix  Dementia: Slow but awake and alert-suspect close to usual baseline-hold due to lethargy Namenda and Aricept.  Acute on chronic debility/weakness: Worsening generalized weakness secondary to acute illness-awaiting PT/OT evaluation-suspect will require SNF.  At baseline she walks around the house with  the help of a walker.    DVT Prophylaxis: Prophylactic Lovenox   Code Status: DNI  Family Communication: Daughter at bedside  Disposition Plan: Remain inpatient-SNF later this week  Antimicrobial agents: Anti-infectives (From admission, onward)   Start     Dose/Rate Route Frequency Ordered Stop   08/06/18 2200  azithromycin (ZITHROMAX) tablet 500 mg     500 mg Oral Daily 08/06/18 1029     08/06/18 1200  cefdinir (OMNICEF) capsule 300 mg     300 mg Oral Every 12 hours 08/06/18 1029     08/03/18 2100  azithromycin (ZITHROMAX) 500 mg in sodium chloride 0.9 % 250 mL IVPB  Status:  Discontinued     500 mg 250 mL/hr over 60 Minutes Intravenous Every 24 hours 08/03/18 1956 08/06/18 1029   08/03/18 2030  cefTRIAXone (ROCEPHIN) 1 g in sodium chloride 0.9 % 100 mL IVPB  Status:  Discontinued     1 g 200 mL/hr over 30 Minutes Intravenous Every 24 hours 08/03/18 1956 08/06/18 1029   08/03/18 1530  ceFEPIme (MAXIPIME) 2 g in sodium chloride 0.9 % 100 mL IVPB     2 g 200 mL/hr over 30 Minutes Intravenous  Once 08/03/18 1522 08/03/18 1649   08/03/18 1530  metroNIDAZOLE (FLAGYL) IVPB 500 mg  Status:  Discontinued     500 mg 100 mL/hr over 60 Minutes Intravenous Every 8 hours 08/03/18 1522 08/03/18 1947   08/03/18 1530  vancomycin (VANCOCIN) IVPB 1000 mg/200 mL premix     1,000 mg 200 mL/hr over 60 Minutes Intravenous  Once 08/03/18 1522 08/03/18 1856      Procedures: None  CONSULTS:  None  Time spent: 25- minutes-Greater than 50% of  this time was spent in counseling, explanation of diagnosis, planning of further management, and coordination of care.  MEDICATIONS: Scheduled Meds: . acidophilus  1 capsule Oral Q breakfast  . azithromycin  500 mg Oral Daily  . bethanechol  10 mg Oral Q breakfast  . carvedilol  6.25 mg Oral BID WC  . cefdinir  300 mg Oral Q12H  . cholecalciferol  2,000 Units Oral Daily  . clonazePAM  0.5 mg Oral BID WC  . enoxaparin (LOVENOX) injection  40 mg  Subcutaneous Q24H  . hydrocortisone cream   Topical BID  . ipratropium-albuterol  3 mL Nebulization TID  . irbesartan  150 mg Oral Daily  . levETIRAcetam  750 mg Oral BID WC  . mometasone-formoterol  2 puff Inhalation BID  . montelukast  5 mg Oral Q supper  . multivitamin with minerals  1 tablet Oral Q supper  . omega-3 acid ethyl esters  1 g Oral Daily  . pantoprazole  40 mg Oral Daily  . pravastatin  20 mg Oral Q supper  . pregabalin  50 mg Oral BID WC   Continuous Infusions: . sodium chloride Stopped (08/06/18 1944)   PRN Meds:.acetaminophen, albuterol, dextromethorphan-guaiFENesin, estradiol, hydrALAZINE, hydrocortisone, loratadine, ondansetron (ZOFRAN) IV, polyvinyl alcohol   PHYSICAL EXAM: Vital signs: Vitals:   08/07/18 1630 08/07/18 1640 08/07/18 1650 08/07/18 1718  BP:    (!) 148/73  Pulse:    83  Resp: 20 (!) 21 (!) 21 (!) 2  Temp:    98.2 F (36.8 C)  TempSrc:    Oral  SpO2:    95%  Weight:      Height:       Filed Weights   08/03/18 1322  Weight: 60.8 kg   Body mass index is 27.06 kg/m.   General appearance:Awake, alert, not in any distress.  Looks weak Eyes:no scleral icterus. HEENT: Atraumatic and Normocephalic Neck: supple, no JVD. Resp:Good air entry bilaterally,no rales or rhonchi CVS: S1 S2 regular GI: Bowel sounds present, Non tender and not distended with no gaurding, rigidity or rebound. Extremities: B/L Lower Ext shows no edema, both legs are warm to touch Neurology:  Non focal Musculoskeletal:No digital cyanosis Skin:No Rash, warm and dry Wounds:N/A  I have personally reviewed following labs and imaging studies  LABORATORY DATA: CBC: Recent Labs  Lab 08/03/18 1334 08/05/18 0404 08/06/18 0442  WBC 13.9* 8.8 7.1  HGB 15.7* 11.4* 12.0  HCT 47.0* 34.5* 35.4*  MCV 90.6 90.6 89.6  PLT 171 152 673    Basic Metabolic Panel: Recent Labs  Lab 08/03/18 1334 08/05/18 0404 08/06/18 0442 08/07/18 0326  NA 137 137 140 138  K 4.1 3.5  3.6 3.8  CL 103 109 107 106  CO2 24 20* 23 24  GLUCOSE 110* 98 100* 102*  BUN 11 10 8  6*  CREATININE 0.96 0.81 0.73 0.72  CALCIUM 9.6 8.4* 8.8* 9.0  MG  --   --   --  1.8    GFR: Estimated Creatinine Clearance: 38.5 mL/min (by C-G formula based on SCr of 0.72 mg/dL).  Liver Function Tests: No results for input(s): AST, ALT, ALKPHOS, BILITOT, PROT, ALBUMIN in the last 168 hours. No results for input(s): LIPASE, AMYLASE in the last 168 hours. No results for input(s): AMMONIA in the last 168 hours.  Coagulation Profile: No results for input(s): INR, PROTIME in the last 168 hours.  Cardiac Enzymes: No results for input(s): CKTOTAL, CKMB, CKMBINDEX, TROPONINI in the last 168 hours.  BNP (last  3 results) No results for input(s): PROBNP in the last 8760 hours.  HbA1C: No results for input(s): HGBA1C in the last 72 hours.  CBG: Recent Labs  Lab 08/03/18 1615  GLUCAP 110*    Lipid Profile: No results for input(s): CHOL, HDL, LDLCALC, TRIG, CHOLHDL, LDLDIRECT in the last 72 hours.  Thyroid Function Tests: No results for input(s): TSH, T4TOTAL, FREET4, T3FREE, THYROIDAB in the last 72 hours.  Anemia Panel: No results for input(s): VITAMINB12, FOLATE, FERRITIN, TIBC, IRON, RETICCTPCT in the last 72 hours.  Urine analysis:    Component Value Date/Time   COLORURINE YELLOW 08/03/2018 [place] 08/03/2018 1650   LABSPEC 1.015 08/03/2018 1650   PHURINE 7.0 08/03/2018 1650   GLUCOSEU NEGATIVE 08/03/2018 1650   HGBUR NEGATIVE 08/03/2018 1650   BILIRUBINUR NEGATIVE 08/03/2018 1650   KETONESUR 20 (A) 08/03/2018 1650   PROTEINUR 30 (A) 08/03/2018 1650   NITRITE NEGATIVE 08/03/2018 1650   LEUKOCYTESUR NEGATIVE 08/03/2018 1650    Sepsis Labs: Lactic Acid, Venous    Component Value Date/Time   LATICACIDVEN 1.6 08/03/2018 2111    MICROBIOLOGY: Recent Results (from the past 240 hour(s))  Culture, blood (Routine X 2) w Reflex to ID Panel     Status: None  (Preliminary result)   Collection Time: 08/03/18  1:35 PM  Result Value Ref Range Status   Specimen Description BLOOD LEFT ANTECUBITAL  Final   Special Requests   Final    BOTTLES DRAWN AEROBIC AND ANAEROBIC Blood Culture adequate volume   Culture   Final    NO GROWTH 4 DAYS Performed at [facility], [place] [address] [place], [place] [address]    Report Status PENDING  Incomplete  Culture, blood (Routine X 2) w Reflex to ID Panel     Status: None (Preliminary result)   Collection Time: 08/03/18  2:45 PM  Result Value Ref Range Status   Specimen Description BLOOD LEFT WRIST  Final   Special Requests   Final    BOTTLES DRAWN AEROBIC AND ANAEROBIC Blood Culture adequate volume   Culture   Final    NO GROWTH 4 DAYS Performed at [facility], [place] [address], [place],  [address]    Report Status PENDING  Incomplete  Respiratory Panel by PCR     Status: None   Collection Time: 08/03/18  7:54 PM  Result Value Ref Range Status   Adenovirus NOT DETECTED NOT DETECTED Final   Coronavirus 229E NOT DETECTED NOT DETECTED Final   Coronavirus HKU1 NOT DETECTED NOT DETECTED Final   Coronavirus NL63 NOT DETECTED NOT DETECTED Final   Coronavirus OC43 NOT DETECTED NOT DETECTED Final   Metapneumovirus NOT DETECTED NOT DETECTED Final   Rhinovirus / Enterovirus NOT DETECTED NOT DETECTED Final   Influenza A NOT DETECTED NOT DETECTED Final   Influenza A H1 NOT DETECTED NOT DETECTED Final   Influenza A H1 2009 NOT DETECTED NOT DETECTED Final   Influenza A H3 NOT DETECTED NOT DETECTED Final   Influenza B NOT DETECTED NOT DETECTED Final   Parainfluenza Virus 1 NOT DETECTED NOT DETECTED Final   Parainfluenza Virus 2 NOT DETECTED NOT DETECTED Final   Parainfluenza Virus 3 NOT DETECTED NOT DETECTED Final   Parainfluenza Virus 4 NOT DETECTED NOT DETECTED Final   Respiratory Syncytial Virus NOT DETECTED NOT DETECTED Final   Bordetella pertussis NOT DETECTED NOT DETECTED Final    Chlamydophila pneumoniae NOT DETECTED NOT DETECTED Final   Mycoplasma pneumoniae NOT  DETECTED NOT DETECTED Final    Comment: Performed at [facility], [place] [address] [place], [place] [address]    RADIOLOGY STUDIES/RESULTS: Dg Chest 2 View  Result Date: 08/07/2018 CLINICAL DATA:  Followup left midlung infiltrate EXAM: CHEST - 2 VIEW COMPARISON:  07/03/2018 FINDINGS: Cardiac shadow is stable. Mild left basilar atelectatic changes are noted slightly increased when compare with the prior exam. Patchy infiltrate is again noted in the left mid lung similar to that seen on the prior exam. A tiny left pleural effusion is noted as well. No acute bony abnormality is noted. Previously seen vertebral anomaly is not appreciated on this exam. IMPRESSION: New mild left basilar atelectasis with small effusion. Persistent but slightly improved infiltrate in the left mid lung. Electronically Signed   By: Inez Catalina M.D.   On: 08/07/2018 12:15   Dg Chest 2 View  Result Date: 08/03/2018 CLINICAL DATA:  Fever with weakness and shortness of breath. EXAM: CHEST - 2 VIEW COMPARISON:  03/28/2018 and 12/31/2017 radiographs. FINDINGS: The heart size and mediastinal contours are stable allowing for positional differences. There is aortic atherosclerosis. There is new left perihilar opacity on the frontal examination, likely representing pneumonia in this clinical context. The right lung is clear. There is no pleural effusion or pneumothorax. Vertebral body anomaly and focal kyphosis near the thoracolumbar junction are again noted. IMPRESSION: New left perihilar opacity most consistent with pneumonia in this clinical context. Followup PA and lateral chest X-ray is recommended in 3-4 weeks following trial of antibiotic therapy to ensure resolution and exclude underlying malignancy. Electronically Signed   By: Richardean Sale M.D.   On: 08/03/2018 16:01   Ct Head Wo Contrast  Result Date: 08/03/2018 CLINICAL DATA:   Uncontrolled tremors over the last 2 days. Decreased responsiveness. History of hypertension and seizures. EXAM: CT HEAD WITHOUT CONTRAST TECHNIQUE: Contiguous axial images were obtained from the base of the skull through the vertex without intravenous contrast. COMPARISON:  CT head 03/09/2017 and 02/27/2017. FINDINGS: Brain: There is no evidence of acute intracranial hemorrhage, mass lesion, brain edema or extra-axial fluid collection. There is stable mild atrophy with mild ventricular dilatation. There is extensive confluent low-density in the periventricular and subcortical white matter, similar to previous study. There is no CT evidence of acute cortical infarction. Vascular: Intracranial vascular calcifications. No hyperdense vessel identified. Skull: The calvarium appears demineralized, especially the clivus. No focal marrow lesion or acute osseous findings. Sinuses/Orbits: Stable chronic partial opacification of the left sphenoid sinus and right mastoid air cells. No air-fluid levels. Previous bilateral lens surgery. Other: None. IMPRESSION: 1. No acute intracranial findings demonstrated. 2. Stable extensive periventricular white matter disease, calvarial demineralization and chronic sinus disease. Electronically Signed   By: Richardean Sale M.D.   On: 08/03/2018 17:32     LOS: 3 days   Berle Mull, MD  [facility]  If 7PM-7AM, please contact night-coverage  Please page via [email or web address] [email or web address]-click on MD name and type text message  08/07/2018, 6:49 PM

## 2018-08-07 NOTE — Progress Notes (Signed)
Physical Therapy Treatment Patient Details Name: [name] MRN: [id number] DOB: 06-04-1927 Today's Date: 08/07/2018    History of Present Illness Pt is a 84 y.o. female with medical history significant of dementia, hypertension, hyperlipidemia, COPD, anxiety, CAD, GERD, Takotsubo cardiomyopathy, seizure, ICH, breast cancer (right lumpectomy, no radiation and chemotherapy), IBS, who presents with cough, shortness of breath, generalized weakness, fever and chills.Chest x-ray showed left perihilar infiltration. Treatment for sepsis secondary to lobar pneumonia.     PT Comments    Pt reports being very tired but is willing to try to get up to the chair. Pt found to be incontinent of urine and was able to sit EoB to have her back cleaned and stand for pericare. Pt requires modA for bed mobility and modA for sit<>stand and stand pivot transfer to chair. After cleaning up pt too fatigued to attempt ambulation. D/c plans remain appropriate. PT will continue to follow acutely.    Follow Up Recommendations  SNF     Equipment Recommendations  Other (comment)(TBD at next venue)       Precautions / Restrictions Precautions Precautions: Fall Restrictions Weight Bearing Restrictions: No    Mobility  Bed Mobility Overal bed mobility: Needs Assistance Bed Mobility: Supine to Sit     Supine to sit: Mod assist     General bed mobility comments: modA for LE management off bed, trunk to upright and pad scoot of hips to EoB, pt able to initiate all movements but requires assist  Transfers Overall transfer level: Needs assistance Equipment used: Rolling walker (2 wheeled) Transfers: Sit to/from Stand Sit to Stand: Mod assist Stand pivot transfers: Mod assist       General transfer comment: modA for sit<>stand to clean up after incontinence of urine, modA for stand pivot transfer to chair, pt with minor buckling of knees.   Ambulation/Gait             General Gait Details:  unable to attempt, pt with increased fatigue       Balance Overall balance assessment: Needs assistance Sitting-balance support: Feet supported;Bilateral upper extremity supported Sitting balance-Leahy Scale: Poor     Standing balance support: Bilateral upper extremity supported Standing balance-Leahy Scale: Zero Standing balance comment: requires B UE and therapist support to maintain balance                            Cognition Arousal/Alertness: Awake/alert Behavior During Therapy: WFL for tasks assessed/performed Overall Cognitive Status: Within Functional Limits for tasks assessed                                           General Comments General comments (skin integrity, edema, etc.): Daughter present throughout session, Pt on 2 L O2 via [place] SaO2 >90%O2 during session       Pertinent Vitals/Pain Pain Assessment: No/denies pain           PT Goals (current goals can now be found in the care plan section) Acute Rehab PT Goals Patient Stated Goal: go home(GO HOME, PATIENT DTR WANTS HER TO GO TO [facility] FOR REHAB.) PT Goal Formulation: With patient Time For Goal Achievement: 08/19/18 Potential to Achieve Goals: Fair Progress towards PT goals: Progressing toward goals    Frequency    Min 2X/week      PT Plan Current plan remains appropriate  AM-PAC PT "6 Clicks" Daily Activity  Outcome Measure  Difficulty turning over in bed (including adjusting bedclothes, sheets and blankets)?: Unable Difficulty moving from lying on back to sitting on the side of the bed? : Unable Difficulty sitting down on and standing up from a chair with arms (e.g., wheelchair, bedside commode, etc,.)?: Unable Help needed moving to and from a bed to chair (including a wheelchair)?: A Lot Help needed walking in hospital room?: Total Help needed climbing 3-5 steps with a railing? : Total 6 Click Score: 7    End of Session Equipment Utilized During  Treatment: Gait belt;Oxygen Activity Tolerance: Patient tolerated treatment well Patient left: in chair;with call bell/phone within reach;with chair alarm set;with family/visitor present Nurse Communication: Mobility status PT Visit Diagnosis: Unsteadiness on feet (R26.81);Other abnormalities of gait and mobility (R26.89);Repeated falls (R29.6);Muscle weakness (generalized) (M62.81);History of falling (Z91.81);Difficulty in walking, not elsewhere classified (R26.2)     Time: 1546-1610 PT Time Calculation (min) (ACUTE ONLY): 24 min  Charges:  $Therapeutic Activity: 23-37 mins                     Nazanin Kinner B. Migdalia Dk PT, DPT Acute Rehabilitation Services Pager [phone] Office [phone]    [place] 08/07/2018, 4:27 PM

## 2018-08-07 NOTE — Progress Notes (Signed)
PT Cancellation Note  Patient Details Name: [name] MRN: [id number] DOB: 05-15-1927   Cancelled Treatment:    Reason Eval/Treat Not Completed: (P) Patient at procedure or test/unavailable Pt off floor at a procedure. PT will follow back as able.   Malayla Granberry B. Migdalia Dk PT, DPT Acute Rehabilitation Services Pager [phone] Office [phone]  [place] 08/07/2018, 12:32 PM

## 2018-08-08 LAB — CULTURE, BLOOD (ROUTINE X 2)
Culture: NO GROWTH
Culture: NO GROWTH
SPECIAL REQUESTS: ADEQUATE
Special Requests: ADEQUATE

## 2018-08-08 LAB — BASIC METABOLIC PANEL
Anion gap: 10 (ref 5–15)
BUN: 11 mg/dL (ref 8–23)
CHLORIDE: 102 mmol/L (ref 98–111)
CO2: 24 mmol/L (ref 22–32)
Calcium: 9.6 mg/dL (ref 8.9–10.3)
Creatinine, Ser: 0.71 mg/dL (ref 0.44–1.00)
GFR calc non Af Amer: >60 mL/min (ref >60)
Glucose, Bld: 103 mg/dL — ABNORMAL HIGH (ref 70–99)
POTASSIUM: 3.7 mmol/L (ref 3.5–5.1)
SODIUM: 136 mmol/L (ref 135–145)

## 2018-08-08 MED ORDER — CARVEDILOL 3.125 MG PO TABS
3.1250 mg | ORAL_TABLET | Freq: Two times a day (BID) | ORAL | Status: DC
  Administered 2018-08-08 – 2018-08-09 (×3): 3.125 mg via ORAL
  Filled 2018-08-08 (×2): qty 1

## 2018-08-08 NOTE — Progress Notes (Signed)
Notified on call about  4.25 vent standstill and hear block. Pt asymotomatic

## 2018-08-08 NOTE — Progress Notes (Signed)
PROGRESS NOTE        PATIENT DETAILS Name: [name] Age: 84 y.o. Sex: female Date of Birth: 12-23-1926 Admit Date: 08/03/2018 Admitting Physician Ivor Costa, MD [id number], Audree Camel, MD  Brief Narrative: Patient is a 84 y.o. female with prior history of COPD, tachycardia Subu cardiomyopathy, seizure disorder, anxiety, GERD-who presented with 4-5-day history of worsening shortness of breath, cough,subjective fever and generalized weakness.  She was thought to have pneumonia and admitted to the hospitalist service.  See below for further details.  Subjective: Feeling more better this morning still fatigue and tired but less sleepy and drowsy.  No nausea no vomiting.  Assessment/Plan: Sepsis secondary to lobar pneumonia: Sepsis pathophysiology has improved, blood cultures negative, respiratory virus panel negative-she appears significantly weak and deconditioned-improved-she is not yet back to baseline.  Treated with Rocephin and Zithromax.  Switch to p.o.  Suspect will need SNF on discharge.  Mobilize with PT-out of bed to chair.  Aspiration precaution, must be up for meals.  COPD: Stable-not wheezing-continue bronchodilators.   History of takatsubo cardiomyopathy: Volume status stable-stop all IV fluids.  Recent EF on TTE (9/16) was 60-65%.    Hypertension: BP controlled-continue Coreg-dose increased.  Holding other antihypertensive medications.  Seizure disorder: Stable-continue with  Keppra  Anxiety/depression: Appears stable-continue Klonopin, hold doxepin and Paxil.    CAD: No anginal symptoms-appears stable.  Dyslipidemia: Continue statin  GERD: Continue Protonix  Dementia: Slow but awake and alert-suspect close to usual baseline-hold due to lethargy Namenda and Aricept.  Acute on chronic debility/weakness: Worsening generalized weakness secondary to acute illness-awaiting PT/OT evaluation-suspect will require SNF.  At baseline she walks  around the house with the help of a walker.    DVT Prophylaxis: Prophylactic Lovenox   Code Status: DNI  Family Communication: Daughter at bedside  Disposition Plan: Remain inpatient-SNF later this week  Antimicrobial agents: Anti-infectives (From admission, onward)   Start     Dose/Rate Route Frequency Ordered Stop   08/06/18 2200  azithromycin (ZITHROMAX) tablet 500 mg     500 mg Oral Daily 08/06/18 1029     08/06/18 1200  cefdinir (OMNICEF) capsule 300 mg     300 mg Oral Every 12 hours 08/06/18 1029     08/03/18 2100  azithromycin (ZITHROMAX) 500 mg in sodium chloride 0.9 % 250 mL IVPB  Status:  Discontinued     500 mg 250 mL/hr over 60 Minutes Intravenous Every 24 hours 08/03/18 1956 08/06/18 1029   08/03/18 2030  cefTRIAXone (ROCEPHIN) 1 g in sodium chloride 0.9 % 100 mL IVPB  Status:  Discontinued     1 g 200 mL/hr over 30 Minutes Intravenous Every 24 hours 08/03/18 1956 08/06/18 1029   08/03/18 1530  ceFEPIme (MAXIPIME) 2 g in sodium chloride 0.9 % 100 mL IVPB     2 g 200 mL/hr over 30 Minutes Intravenous  Once 08/03/18 1522 08/03/18 1649   08/03/18 1530  metroNIDAZOLE (FLAGYL) IVPB 500 mg  Status:  Discontinued     500 mg 100 mL/hr over 60 Minutes Intravenous Every 8 hours 08/03/18 1522 08/03/18 1947   08/03/18 1530  vancomycin (VANCOCIN) IVPB 1000 mg/200 mL premix     1,000 mg 200 mL/hr over 60 Minutes Intravenous  Once 08/03/18 1522 08/03/18 1856      Procedures: None  CONSULTS:  None  Time spent: 25- minutes-Greater  than 50% of this time was spent in counseling, explanation of diagnosis, planning of further management, and coordination of care.  MEDICATIONS: Scheduled Meds: . acidophilus  1 capsule Oral Q breakfast  . azithromycin  500 mg Oral Daily  . bethanechol  10 mg Oral Q breakfast  . carvedilol  3.125 mg Oral BID WC  . cefdinir  300 mg Oral Q12H  . cholecalciferol  2,000 Units Oral Daily  . clonazePAM  0.5 mg Oral BID WC  . enoxaparin  (LOVENOX) injection  40 mg Subcutaneous Q24H  . hydrocortisone cream   Topical BID  . ipratropium-albuterol  3 mL Nebulization TID  . irbesartan  150 mg Oral Daily  . levETIRAcetam  750 mg Oral BID WC  . mometasone-formoterol  2 puff Inhalation BID  . montelukast  5 mg Oral Q supper  . multivitamin with minerals  1 tablet Oral Q supper  . omega-3 acid ethyl esters  1 g Oral Daily  . pantoprazole  40 mg Oral Daily  . pravastatin  20 mg Oral Q supper  . pregabalin  50 mg Oral BID WC   Continuous Infusions: . sodium chloride Stopped (08/06/18 1944)   PRN Meds:.acetaminophen, albuterol, dextromethorphan-guaiFENesin, estradiol, hydrALAZINE, hydrocortisone, loratadine, ondansetron (ZOFRAN) IV, polyvinyl alcohol   PHYSICAL EXAM: Vital signs: Vitals:   08/08/18 0820 08/08/18 0821 08/08/18 1410 08/08/18 1726  BP:    (!) 176/64  Pulse: 80  93 85  Resp: 18  (!) 22 17  Temp:    98.4 F (36.9 C)  TempSrc:    Oral  SpO2: 97% 97% 99% 96%  Weight:      Height:       Filed Weights   08/03/18 1322  Weight: 60.8 kg   Body mass index is 27.06 kg/m.   General appearance:Awake, alert, not in any distress.  Looks weak Eyes:no scleral icterus. HEENT: Atraumatic and Normocephalic Neck: supple, no JVD. Resp:Good air entry bilaterally,no rales or rhonchi CVS: S1 S2 regular GI: Bowel sounds present, Non tender and not distended with no gaurding, rigidity or rebound. Extremities: B/L Lower Ext shows no edema, both legs are warm to touch Neurology:  Non focal Musculoskeletal:No digital cyanosis Skin:No Rash, warm and dry Wounds:N/A  I have personally reviewed following labs and imaging studies  LABORATORY DATA: CBC: Recent Labs  Lab 08/03/18 1334 08/05/18 0404 08/06/18 0442  WBC 13.9* 8.8 7.1  HGB 15.7* 11.4* 12.0  HCT 47.0* 34.5* 35.4*  MCV 90.6 90.6 89.6  PLT 171 152 242    Basic Metabolic Panel: Recent Labs  Lab 08/03/18 1334 08/05/18 0404 08/06/18 0442 08/07/18 0326  08/08/18 0335  NA 137 137 140 138 136  K 4.1 3.5 3.6 3.8 3.7  CL 103 109 107 106 102  CO2 24 20* 23 24 24   GLUCOSE 110* 98 100* 102* 103*  BUN 11 10 8  6* 11  CREATININE 0.96 0.81 0.73 0.72 0.71  CALCIUM 9.6 8.4* 8.8* 9.0 9.6  MG  --   --   --  1.8  --     GFR: Estimated Creatinine Clearance: 38.5 mL/min (by C-G formula based on SCr of 0.71 mg/dL).  Liver Function Tests: No results for input(s): AST, ALT, ALKPHOS, BILITOT, PROT, ALBUMIN in the last 168 hours. No results for input(s): LIPASE, AMYLASE in the last 168 hours. No results for input(s): AMMONIA in the last 168 hours.  Coagulation Profile: No results for input(s): INR, PROTIME in the last 168 hours.  Cardiac Enzymes: No results  for input(s): CKTOTAL, CKMB, CKMBINDEX, TROPONINI in the last 168 hours.  BNP (last 3 results) No results for input(s): PROBNP in the last 8760 hours.  HbA1C: No results for input(s): HGBA1C in the last 72 hours.  CBG: Recent Labs  Lab 08/03/18 1615  GLUCAP 110*    Lipid Profile: No results for input(s): CHOL, HDL, LDLCALC, TRIG, CHOLHDL, LDLDIRECT in the last 72 hours.  Thyroid Function Tests: No results for input(s): TSH, T4TOTAL, FREET4, T3FREE, THYROIDAB in the last 72 hours.  Anemia Panel: No results for input(s): VITAMINB12, FOLATE, FERRITIN, TIBC, IRON, RETICCTPCT in the last 72 hours.  Urine analysis:    Component Value Date/Time   COLORURINE YELLOW 08/03/2018 [place] 08/03/2018 1650   LABSPEC 1.015 08/03/2018 1650   PHURINE 7.0 08/03/2018 1650   GLUCOSEU NEGATIVE 08/03/2018 1650   HGBUR NEGATIVE 08/03/2018 1650   BILIRUBINUR NEGATIVE 08/03/2018 1650   KETONESUR 20 (A) 08/03/2018 1650   PROTEINUR 30 (A) 08/03/2018 1650   NITRITE NEGATIVE 08/03/2018 1650   LEUKOCYTESUR NEGATIVE 08/03/2018 1650    Sepsis Labs: Lactic Acid, Venous    Component Value Date/Time   LATICACIDVEN 1.6 08/03/2018 2111    MICROBIOLOGY: Recent Results (from the past  240 hour(s))  Culture, blood (Routine X 2) w Reflex to ID Panel     Status: None   Collection Time: 08/03/18  1:35 PM  Result Value Ref Range Status   Specimen Description BLOOD LEFT ANTECUBITAL  Final   Special Requests   Final    BOTTLES DRAWN AEROBIC AND ANAEROBIC Blood Culture adequate volume   Culture   Final    NO GROWTH 5 DAYS Performed at [facility], [place] [address], [place], [place] [address]    Report Status 08/08/2018 FINAL  Final  Culture, blood (Routine X 2) w Reflex to ID Panel     Status: None   Collection Time: 08/03/18  2:45 PM  Result Value Ref Range Status   Specimen Description BLOOD LEFT WRIST  Final   Special Requests   Final    BOTTLES DRAWN AEROBIC AND ANAEROBIC Blood Culture adequate volume   Culture   Final    NO GROWTH 5 DAYS Performed at [facility], [place] [address] [place], [place] [address]    Report Status 08/08/2018 FINAL  Final  Respiratory Panel by PCR     Status: None   Collection Time: 08/03/18  7:54 PM  Result Value Ref Range Status   Adenovirus NOT DETECTED NOT DETECTED Final   Coronavirus 229E NOT DETECTED NOT DETECTED Final   Coronavirus HKU1 NOT DETECTED NOT DETECTED Final   Coronavirus NL63 NOT DETECTED NOT DETECTED Final   Coronavirus OC43 NOT DETECTED NOT DETECTED Final   Metapneumovirus NOT DETECTED NOT DETECTED Final   Rhinovirus / Enterovirus NOT DETECTED NOT DETECTED Final   Influenza A NOT DETECTED NOT DETECTED Final   Influenza A H1 NOT DETECTED NOT DETECTED Final   Influenza A H1 2009 NOT DETECTED NOT DETECTED Final   Influenza A H3 NOT DETECTED NOT DETECTED Final   Influenza B NOT DETECTED NOT DETECTED Final   Parainfluenza Virus 1 NOT DETECTED NOT DETECTED Final   Parainfluenza Virus 2 NOT DETECTED NOT DETECTED Final   Parainfluenza Virus 3 NOT DETECTED NOT DETECTED Final   Parainfluenza Virus 4 NOT DETECTED NOT DETECTED Final   Respiratory Syncytial Virus NOT DETECTED NOT DETECTED Final   Bordetella  pertussis NOT DETECTED NOT DETECTED Final  Chlamydophila pneumoniae NOT DETECTED NOT DETECTED Final   Mycoplasma pneumoniae NOT DETECTED NOT DETECTED Final    Comment: Performed at [facility], [place] [address] [place], [place] [address]    RADIOLOGY STUDIES/RESULTS: Dg Chest 2 View  Result Date: 08/07/2018 CLINICAL DATA:  Followup left midlung infiltrate EXAM: CHEST - 2 VIEW COMPARISON:  07/03/2018 FINDINGS: Cardiac shadow is stable. Mild left basilar atelectatic changes are noted slightly increased when compare with the prior exam. Patchy infiltrate is again noted in the left mid lung similar to that seen on the prior exam. A tiny left pleural effusion is noted as well. No acute bony abnormality is noted. Previously seen vertebral anomaly is not appreciated on this exam. IMPRESSION: New mild left basilar atelectasis with small effusion. Persistent but slightly improved infiltrate in the left mid lung. Electronically Signed   By: Inez Catalina M.D.   On: 08/07/2018 12:15   Dg Chest 2 View  Result Date: 08/03/2018 CLINICAL DATA:  Fever with weakness and shortness of breath. EXAM: CHEST - 2 VIEW COMPARISON:  03/28/2018 and 12/31/2017 radiographs. FINDINGS: The heart size and mediastinal contours are stable allowing for positional differences. There is aortic atherosclerosis. There is new left perihilar opacity on the frontal examination, likely representing pneumonia in this clinical context. The right lung is clear. There is no pleural effusion or pneumothorax. Vertebral body anomaly and focal kyphosis near the thoracolumbar junction are again noted. IMPRESSION: New left perihilar opacity most consistent with pneumonia in this clinical context. Followup PA and lateral chest X-ray is recommended in 3-4 weeks following trial of antibiotic therapy to ensure resolution and exclude underlying malignancy. Electronically Signed   By: Richardean Sale M.D.   On: 08/03/2018 16:01   Ct Head Wo  Contrast  Result Date: 08/03/2018 CLINICAL DATA:  Uncontrolled tremors over the last 2 days. Decreased responsiveness. History of hypertension and seizures. EXAM: CT HEAD WITHOUT CONTRAST TECHNIQUE: Contiguous axial images were obtained from the base of the skull through the vertex without intravenous contrast. COMPARISON:  CT head 03/09/2017 and 02/27/2017. FINDINGS: Brain: There is no evidence of acute intracranial hemorrhage, mass lesion, brain edema or extra-axial fluid collection. There is stable mild atrophy with mild ventricular dilatation. There is extensive confluent low-density in the periventricular and subcortical white matter, similar to previous study. There is no CT evidence of acute cortical infarction. Vascular: Intracranial vascular calcifications. No hyperdense vessel identified. Skull: The calvarium appears demineralized, especially the clivus. No focal marrow lesion or acute osseous findings. Sinuses/Orbits: Stable chronic partial opacification of the left sphenoid sinus and right mastoid air cells. No air-fluid levels. Previous bilateral lens surgery. Other: None. IMPRESSION: 1. No acute intracranial findings demonstrated. 2. Stable extensive periventricular white matter disease, calvarial demineralization and chronic sinus disease. Electronically Signed   By: Richardean Sale M.D.   On: 08/03/2018 17:32     LOS: 4 days   Berle Mull, MD  [facility]  If 7PM-7AM, please contact night-coverage  Please page via [email or web address]-click on MD name and type text message  08/08/2018, 6:34 PM

## 2018-08-08 NOTE — Progress Notes (Signed)
Pt's daughter notes that pt has had increased lethargy after taking Lyrica. Pt has klonopin and keppra given at same time lyrica is taken. RN will continue to monitor pt.

## 2018-08-08 NOTE — Progress Notes (Signed)
Patient had 5 beat run of SVT. The on call provider has been notified.  The patient was asymptomatic.  Vital signs are stable and within defined limits.

## 2018-08-09 LAB — CBC
HCT: 36.4 % (ref 36.0–46.0)
Hemoglobin: 12.3 g/dL (ref 12.0–15.0)
MCH: 30.1 pg (ref 26.0–34.0)
MCHC: 33.8 g/dL (ref 30.0–36.0)
MCV: 89 fL (ref 78.0–100.0)
Platelets: 282 10*3/uL (ref 150–400)
RBC: 4.09 MIL/uL (ref 3.87–5.11)
RDW: 11.8 % (ref 11.5–15.5)
WBC: 8.3 10*3/uL (ref 4.0–10.5)

## 2018-08-09 LAB — BASIC METABOLIC PANEL
Anion gap: 9 (ref 5–15)
BUN: 9 mg/dL (ref 8–23)
CO2: 24 mmol/L (ref 22–32)
Calcium: 9.2 mg/dL (ref 8.9–10.3)
Chloride: 101 mmol/L (ref 98–111)
Creatinine, Ser: 0.75 mg/dL (ref 0.44–1.00)
GFR calc Af Amer: >60 mL/min (ref >60)
GLUCOSE: 106 mg/dL — AB (ref 70–99)
Potassium: 3.8 mmol/L (ref 3.5–5.1)
Sodium: 134 mmol/L — ABNORMAL LOW (ref 135–145)

## 2018-08-09 LAB — MAGNESIUM: MAGNESIUM: 1.8 mg/dL (ref 1.7–2.4)

## 2018-08-09 MED ORDER — PREGABALIN 50 MG PO CAPS: 50.0000 mg | ORAL_CAPSULE | Freq: Two times a day (BID) | ORAL | 0 refills | Status: AC

## 2018-08-09 MED ORDER — ENSURE ENLIVE PO LIQD: 237.0000 mL | Freq: Two times a day (BID) | ORAL | 0 refills | Status: DC

## 2018-08-09 MED ORDER — PAROXETINE HCL 10 MG PO TABS: 10.0000 mg | ORAL_TABLET | Freq: Every day | ORAL | Status: AC

## 2018-08-09 MED ORDER — DONEPEZIL HCL 5 MG PO TABS: 5.0000 mg | ORAL_TABLET | Freq: Every day | ORAL | 19 refills | Status: AC

## 2018-08-09 MED ORDER — AZITHROMYCIN 500 MG PO TABS: 500.0000 mg | ORAL_TABLET | Freq: Every day | ORAL | 0 refills | Status: AC

## 2018-08-09 MED ORDER — HYDROCORTISONE 1 % EX CREA: TOPICAL_CREAM | Freq: Two times a day (BID) | CUTANEOUS | 0 refills | Status: AC

## 2018-08-09 MED ORDER — GUAIFENESIN ER 600 MG PO TB12: 600.0000 mg | ORAL_TABLET | Freq: Two times a day (BID) | ORAL | 2 refills | Status: AC

## 2018-08-09 MED ORDER — ENSURE ENLIVE PO LIQD
237.0000 mL | Freq: Two times a day (BID) | ORAL | Status: DC
  Administered 2018-08-09: 237 mL via ORAL

## 2018-08-09 MED ORDER — CEFDINIR 300 MG PO CAPS: 300.0000 mg | ORAL_CAPSULE | Freq: Two times a day (BID) | ORAL | 0 refills | Status: AC

## 2018-08-09 MED ORDER — CLONAZEPAM 0.5 MG PO TABS: 0.5000 mg | ORAL_TABLET | Freq: Two times a day (BID) | ORAL | 0 refills | Status: AC

## 2018-08-09 MED ORDER — MEMANTINE HCL 5 MG PO TABS: 5.0000 mg | ORAL_TABLET | Freq: Every day | ORAL | Status: AC

## 2018-08-09 MED ORDER — IPRATROPIUM-ALBUTEROL 0.5-2.5 (3) MG/3ML IN SOLN: 3.0000 mL | Freq: Three times a day (TID) | RESPIRATORY_TRACT | 0 refills | Status: AC

## 2018-08-09 NOTE — Discharge Summary (Signed)
[facility] Discharge Summary   Patient: [name] JXB:[id number]   PCP: Myrlene Broker, MD DOB: [birth year]   Date of admission: 08/03/2018   Date of discharge:  08/09/2018    Discharge Diagnoses:  Principal Problem:   Lobar pneumonia ([place]) Active Problems:   Takotsubo cardiomyopathy   HTN (hypertension)   Dyslipidemia   Anxiety   GERD (gastroesophageal reflux disease)   Chronic obstructive pulmonary disease (HCC)   Depression   CAD (coronary artery disease)   Seizure ([place])   Sepsis ([place])   Dementia   Admitted From: home Disposition:  SNF  Recommendations for Outpatient Follow-up:  1. Please follow up with PCP in 1 week.   2. Please evaluate the need for medications including Namenda, Aricept, Paxil and resume that in the order mentioned as needed    Contact information for follow-up providers    Myrlene Broker, MD. Schedule an appointment as soon as possible for a visit in 1 week(s).   Specialty:  Family Medicine Contact information: [place] [address] [phone]            Contact information for after-discharge care    Destination    [facility] .   Service:  Skilled Nursing Contact information: [place] [phone]                 Diet recommendation: Cardiac diet  Activity: The patient is advised to gradually reintroduce usual activities.  Discharge Condition: good  Code Status: Partial code, DNI  History of present illness: As per the H and P dictated on admission, "[name] is a 84 y.o. female with medical history significant of dementia, hypertension, hyperlipidemia, COPD, anxiety, CAD, GERD, Takotsubo cardiomyopathy, seizure, ICH, breast cancer (right lumpectomy, no radiation and chemotherapy), IBS, who presents with cough, shortness of breath, generalized weakness, fever and chills.  Per patient's daughter, patient has  been having shortness of breath, cough in the past 2 days.  She coughs up greenish colored sputum.  Patient also has fever and chills.  Denies chest pain.  She has generalized weakness, particularly in both legs.  Patient normally can walk, but is not able to stand up to walk normally due to weakness.  Patient does not have facial droop, slurred speech, vision loss or hearing loss.  Patient has nausea and vomited once when she had severe coughing per her daughter.  Patient has chronic IBS, and chronic intermittent mild diarrhea, which has not changed recently.  No abdominal pain, no symptoms for UTI.  ED Course: pt was found to have WBC 13.9, lactic acid 0.82, negative urinalysis, electrolytes renal function okay, temperature 102.7, no tachycardia, oxygen saturation 87% on room air, CT head is negative for acute intracranial abnormalities.  Chest x-ray showed left perihilar infiltration.  Blood pressures disorder.  Patient is placed on telemetry bed of observation"  Hospital Course:  Summary of her active problems in the hospital is as following. Sepsis secondary to lobar pneumonia:  Sepsis pathophysiology has resolved, Blood cultures negative,  respiratory virus panel negative she appears significantly weak and deconditioned-improved Treated with Rocephin and Zithromax.  Switched to p.o.  Aspiration precaution, must be up for meals.  COPD: Stable-not wheezing-continue bronchodilators and scheduled nebulizers due to fatigue.  Should continue PRN after 1 week.  History of takatsubo cardiomyopathy: Volume status stable- Recent EF on TTE (9/16) was 60-65%.    Hypertension: BP controlled-continue Coreg.  Seizure disorder:  Stable-continue with  Keppra Keppra level mildly elevated, normal upper limit 40 patient's level 42.. Continue Klonopin per home dose as well as its withdrawal has caused seizures in the past.  Anxiety/depression:  Patient is sleepy in the hospital and therefore her  medication including doxepin, Paxil, Aricept and Namenda are on hold. Recommend to slowly add them as needed. continue Klonopin,  CAD: No anginal symptoms-appears stable.  Dyslipidemia: Continue statin  GERD: Continue Protonix  Dementia: Slow but awake and alert-suspect close to usual baseline-hold due to lethargy Namenda and Aricept.  Acute on chronic debility/weakness: Worsening generalized weakness secondary to acute illness-awaiting PT/OT evaluation-suspect will require SNF.  At baseline she walks around the house with the help of a walker.     All other chronic medical condition were stable during the hospitalization.  Patient was seen by physical therapy, who recommended SNF, which was arranged by Education officer, museum and case Freight forwarder. On the day of the discharge the patient's vitals were stable , and no other acute medical condition were reported by patient. the patient was felt safe to be discharge at SNF with therapy.  Consultants: none Procedures: none  DISCHARGE MEDICATION: Allergies as of 08/09/2018      Reactions   Levaquin [levofloxacin] Other (See Comments)   MAY HAVE PRECIPITATED SEIZURE   Other    Rash with EKG leads.    Codeine Anxiety   NERVOUS   Penicillins Rash   Has patient had a PCN reaction causing immediate rash, facial/tongue/throat swelling, SOB or lightheadedness with hypotension:unsure Has patient had a PCN reaction causing severe rash involving mucus membranes or skin necrosis:unsure Has patient had a PCN reaction that required hospitalization:unsure Has patient had a PCN reaction occurring within the last 10 years:No If all of the above answers are "NO", then may proceed with Cephalosporin use.   Promethazine Nausea And Vomiting   "I can take pills, not IV"   Sulfa Antibiotics Itching, Rash   Sulfasalazine Itching, Rash      Medication List    STOP taking these medications   dextromethorphan-guaiFENesin 30-600 MG 12hr tablet Commonly known  as:  MUCINEX DM   nitrofurantoin 50 MG capsule Commonly known as:  MACRODANTIN   SILENOR 3 MG Tabs Generic drug:  Doxepin HCl     TAKE these medications   acetaminophen 500 MG tablet Commonly known as:  TYLENOL Take 1,000 mg by mouth every 6 (six) hours as needed (for pain/headaches.).   albuterol (2.5 MG/3ML) 0.083% nebulizer solution Commonly known as:  PROVENTIL Take 2.5 mg by nebulization every 6 (six) hours as needed for wheezing.   azithromycin 500 MG tablet Commonly known as:  ZITHROMAX Take 1 tablet (500 mg total) by mouth daily for 1 day.   bethanechol 10 MG tablet Commonly known as:  URECHOLINE Take 10 mg by mouth daily with breakfast.   carvedilol 3.125 MG tablet Commonly known as:  COREG Take 1 tablet (3.125 mg total) by mouth 2 (two) times daily with a meal.   cefdinir 300 MG capsule Commonly known as:  OMNICEF Take 1 capsule (300 mg total) by mouth 2 (two) times daily for 1 day.   clonazePAM 0.5 MG tablet Commonly known as:  KLONOPIN Take 1 tablet (0.5 mg total) by mouth 2 (two) times daily with a meal. To prevent breakthrough seizures   clotrimazole-betamethasone cream Commonly known as:  LOTRISONE Apply 1 application topically 2 (two) times daily as needed (for vaginal discomfort).   donepezil 5 MG tablet Commonly known as:  ARICEPT Take 1 tablet (5 mg total) by mouth daily with supper. Start taking on:  08/13/2018 What changed:  These instructions start on 08/13/2018. If you are unsure what to do until then, ask your doctor or other care provider.   esomeprazole 40 MG capsule Commonly known as:  NEXIUM Take 40 mg by mouth daily with breakfast.   estradiol 0.1 MG/GM vaginal cream Commonly known as:  ESTRACE Place 1 Applicatorful vaginally at bedtime as needed (for vaginal discomfort (dryness/itchy)).   feeding supplement (ENSURE ENLIVE) Liqd Take 237 mLs by mouth 2 (two) times daily between meals.   Fish Oil 1000 MG Caps Take 1,000 mg by mouth  2 (two) times daily with a meal.   GLUCOSAMINE-CHONDROITIN PO Take 1 tablet by mouth 2 (two) times daily with a meal.   guaifenesin 100 MG/5ML syrup Commonly known as:  ROBITUSSIN Take 200 mg by mouth 3 (three) times daily as needed for cough. What changed:  Another medication with the same name was added. Make sure you understand how and when to take each.   guaiFENesin 600 MG 12 hr tablet Commonly known as:  MUCINEX Take 1 tablet (600 mg total) by mouth 2 (two) times daily. What changed:  You were already taking a medication with the same name, and this prescription was added. Make sure you understand how and when to take each.   hydrocortisone 2.5 % rectal cream Commonly known as:  ANUSOL-HC Place 1 application rectally 2 (two) times daily as needed for hemorrhoids or itching.   hydrocortisone cream 1 % Apply topically 2 (two) times daily.   ipratropium-albuterol 0.5-2.5 (3) MG/3ML Soln Commonly known as:  DUONEB Take 3 mLs by nebulization 3 (three) times daily for 5 days.   levETIRAcetam 750 MG tablet Commonly known as:  KEPPRA Take 750 mg by mouth 2 (two) times daily with a meal. For seizures   levocetirizine 5 MG tablet Commonly known as:  XYZAL Take 5 mg by mouth daily with breakfast. Once a day as needed   memantine 5 MG tablet Commonly known as:  NAMENDA Take 1 tablet (5 mg total) by mouth daily with supper. Start taking on:  08/16/2018 What changed:  These instructions start on 08/16/2018. If you are unsure what to do until then, ask your doctor or other care provider.   montelukast 5 MG chewable tablet Commonly known as:  SINGULAIR Chew 5 mg by mouth daily with supper.   multivitamin with minerals Tabs tablet Take 1 tablet by mouth daily with supper.   PARoxetine 10 MG tablet Commonly known as:  PAXIL Take 1 tablet (10 mg total) by mouth daily at 2 PM. Start taking on:  08/18/2018 What changed:  These instructions start on 08/18/2018. If you are unsure what  to do until then, ask your doctor or other care provider.   pravastatin 20 MG tablet Commonly known as:  PRAVACHOL Take 1 tablet (20 mg total) by mouth daily at 6 PM. What changed:  when to take this   pregabalin 50 MG capsule Commonly known as:  LYRICA Take 1 capsule (50 mg total) by mouth 2 (two) times daily with a meal.   PRESERVISION AREDS 2 PO Take 1 capsule by mouth 2 (two) times daily with a meal.   PROBIOTIC FORMULA Caps Take 1 capsule by mouth daily with breakfast.   RAPAFLO 4 MG Caps capsule Generic drug:  silodosin Take 4 mg by mouth daily with breakfast.   REFRESH LIQUIGEL 1 % Gel Generic  drug:  Carboxymethylcellulose Sodium Place 1 application into both eyes See admin instructions. Instill under eyelid of both eyes every other night   REFRESH PLUS 0.5 % Soln Generic drug:  Carboxymethylcellulose Sod PF Place 1 drop into both eyes 4 (four) times daily as needed (dry eyes).   SYMBICORT 80-4.5 MCG/ACT inhaler Generic drug:  budesonide-formoterol Inhale 2 puffs into the lungs daily.   telmisartan 40 MG tablet Commonly known as:  MICARDIS Take 40 mg by mouth daily with breakfast.   tiotropium 18 MCG inhalation capsule Commonly known as:  SPIRIVA Place 18 mcg into inhaler and inhale at bedtime.   Vitamin D3 2000 units Tabs Take 2,000 Units by mouth daily with breakfast.      Allergies  Allergen Reactions  . Levaquin [Levofloxacin] Other (See Comments)    MAY HAVE PRECIPITATED SEIZURE  . Other     Rash with EKG leads.   . Codeine Anxiety    NERVOUS  . Penicillins Rash    Has patient had a PCN reaction causing immediate rash, facial/tongue/throat swelling, SOB or lightheadedness with hypotension:unsure Has patient had a PCN reaction causing severe rash involving mucus membranes or skin necrosis:unsure Has patient had a PCN reaction that required hospitalization:unsure Has patient had a PCN reaction occurring within the last 10 years:No If all of the  above answers are "NO", then may proceed with Cephalosporin use.   . Promethazine Nausea And Vomiting    "I can take pills, not IV"  . Sulfa Antibiotics Itching and Rash  . Sulfasalazine Itching and Rash   Discharge Instructions    Diet - low sodium heart healthy   Complete by:  As directed    Discharge instructions   Complete by:  As directed    It is important that you read following instructions as well as go over your medication list with RN to help you understand your care after this hospitalization.  Discharge Instructions: Please follow-up with PCP in one week  Please request your primary care physician to go over all Hospital Tests and Procedure/Radiological results at the follow up,  Please get all Hospital records sent to your PCP by signing hospital release before you go home.   Do not take more than prescribed Pain, Sleep and Anxiety Medications. You were cared for by a hospitalist during your hospital stay. If you have any questions about your discharge medications or the care you received while you were in the hospital after you are discharged, you can call the unit and ask to speak with the hospitalist on call if the hospitalist that took care of you is not available.  Once you are discharged, your primary care physician will handle any further medical issues. Please note that NO REFILLS for any discharge medications will be authorized once you are discharged, as it is imperative that you return to your primary care physician (or establish a relationship with a primary care physician if you do not have one) for your aftercare needs so that they can reassess your need for medications and monitor your lab values. You Must read complete instructions/literature along with all the possible adverse reactions/side effects for all the Medicines you take and that have been prescribed to you. Take any new Medicines after you have completely understood and accept all the possible adverse  reactions/side effects. Wear Seat belts while driving. If you have smoked or chewed Tobacco in the last 2 yrs please stop smoking and/or stop any Recreational drug use.   Increase activity  slowly   Complete by:  As directed      Discharge Exam: Filed Weights   08/03/18 1322  Weight: 60.8 kg   Vitals:   08/09/18 0807 08/09/18 0817  BP: (!) 148/59   Pulse: 97   Resp:    Temp: 98.2 F (36.8 C)   SpO2: 95% 96%   General: Appear in no distress, no Rash; Oral Mucosa moist. Cardiovascular: S1 and S2 Present, no Murmur, no JVD Respratory: Bilateral Air entry present and left basal Crackles, no wheezes Abdomen: Bowel Sound present, Soft and no tenderness Extremities: no Pedal edema, no calf tenderness Neurology: Grossly no focal neuro deficit.  The results of significant diagnostics from this hospitalization (including imaging, microbiology, ancillary and laboratory) are listed below for reference.    Significant Diagnostic Studies: Dg Chest 2 View  Result Date: 08/07/2018 CLINICAL DATA:  Followup left midlung infiltrate EXAM: CHEST - 2 VIEW COMPARISON:  07/03/2018 FINDINGS: Cardiac shadow is stable. Mild left basilar atelectatic changes are noted slightly increased when compare with the prior exam. Patchy infiltrate is again noted in the left mid lung similar to that seen on the prior exam. A tiny left pleural effusion is noted as well. No acute bony abnormality is noted. Previously seen vertebral anomaly is not appreciated on this exam. IMPRESSION: New mild left basilar atelectasis with small effusion. Persistent but slightly improved infiltrate in the left mid lung. Electronically Signed   By: Inez Catalina M.D.   On: 08/07/2018 12:15   Dg Chest 2 View  Result Date: 08/03/2018 CLINICAL DATA:  Fever with weakness and shortness of breath. EXAM: CHEST - 2 VIEW COMPARISON:  03/28/2018 and 12/31/2017 radiographs. FINDINGS: The heart size and mediastinal contours are stable allowing for  positional differences. There is aortic atherosclerosis. There is new left perihilar opacity on the frontal examination, likely representing pneumonia in this clinical context. The right lung is clear. There is no pleural effusion or pneumothorax. Vertebral body anomaly and focal kyphosis near the thoracolumbar junction are again noted. IMPRESSION: New left perihilar opacity most consistent with pneumonia in this clinical context. Followup PA and lateral chest X-ray is recommended in 3-4 weeks following trial of antibiotic therapy to ensure resolution and exclude underlying malignancy. Electronically Signed   By: Richardean Sale M.D.   On: 08/03/2018 16:01   Ct Head Wo Contrast  Result Date: 08/03/2018 CLINICAL DATA:  Uncontrolled tremors over the last 2 days. Decreased responsiveness. History of hypertension and seizures. EXAM: CT HEAD WITHOUT CONTRAST TECHNIQUE: Contiguous axial images were obtained from the base of the skull through the vertex without intravenous contrast. COMPARISON:  CT head 03/09/2017 and 02/27/2017. FINDINGS: Brain: There is no evidence of acute intracranial hemorrhage, mass lesion, brain edema or extra-axial fluid collection. There is stable mild atrophy with mild ventricular dilatation. There is extensive confluent low-density in the periventricular and subcortical white matter, similar to previous study. There is no CT evidence of acute cortical infarction. Vascular: Intracranial vascular calcifications. No hyperdense vessel identified. Skull: The calvarium appears demineralized, especially the clivus. No focal marrow lesion or acute osseous findings. Sinuses/Orbits: Stable chronic partial opacification of the left sphenoid sinus and right mastoid air cells. No air-fluid levels. Previous bilateral lens surgery. Other: None. IMPRESSION: 1. No acute intracranial findings demonstrated. 2. Stable extensive periventricular white matter disease, calvarial demineralization and chronic sinus  disease. Electronically Signed   By: Richardean Sale M.D.   On: 08/03/2018 17:32    Microbiology: Recent Results (from the past 240 hour(s))  Culture, blood (Routine X 2) w Reflex to ID Panel     Status: None   Collection Time: 08/03/18  1:35 PM  Result Value Ref Range Status   Specimen Description BLOOD LEFT ANTECUBITAL  Final   Special Requests   Final    BOTTLES DRAWN AEROBIC AND ANAEROBIC Blood Culture adequate volume   Culture   Final    NO GROWTH 5 DAYS Performed at [facility], [address] [place], [place] [address]    Report Status 08/08/2018 FINAL  Final  Culture, blood (Routine X 2) w Reflex to ID Panel     Status: None   Collection Time: 08/03/18  2:45 PM  Result Value Ref Range Status   Specimen Description BLOOD LEFT WRIST  Final   Special Requests   Final    BOTTLES DRAWN AEROBIC AND ANAEROBIC Blood Culture adequate volume   Culture   Final    NO GROWTH 5 DAYS Performed at [facility], [place] [address], [place], [place] [address]    Report Status 08/08/2018 FINAL  Final  Respiratory Panel by PCR     Status: None   Collection Time: 08/03/18  7:54 PM  Result Value Ref Range Status   Adenovirus NOT DETECTED NOT DETECTED Final   Coronavirus 229E NOT DETECTED NOT DETECTED Final   Coronavirus HKU1 NOT DETECTED NOT DETECTED Final   Coronavirus NL63 NOT DETECTED NOT DETECTED Final   Coronavirus OC43 NOT DETECTED NOT DETECTED Final   Metapneumovirus NOT DETECTED NOT DETECTED Final   Rhinovirus / Enterovirus NOT DETECTED NOT DETECTED Final   Influenza A NOT DETECTED NOT DETECTED Final   Influenza A H1 NOT DETECTED NOT DETECTED Final   Influenza A H1 2009 NOT DETECTED NOT DETECTED Final   Influenza A H3 NOT DETECTED NOT DETECTED Final   Influenza B NOT DETECTED NOT DETECTED Final   Parainfluenza Virus 1 NOT DETECTED NOT DETECTED Final   Parainfluenza Virus 2 NOT DETECTED NOT DETECTED Final   Parainfluenza Virus 3 NOT DETECTED NOT DETECTED Final    Parainfluenza Virus 4 NOT DETECTED NOT DETECTED Final   Respiratory Syncytial Virus NOT DETECTED NOT DETECTED Final   Bordetella pertussis NOT DETECTED NOT DETECTED Final   Chlamydophila pneumoniae NOT DETECTED NOT DETECTED Final   Mycoplasma pneumoniae NOT DETECTED NOT DETECTED Final    Comment: Performed at [facility], [place] [address], [place], [place] [address]     Labs: CBC: Recent Labs  Lab 08/03/18 1334 08/05/18 0404 08/06/18 0442 08/09/18 0228  WBC 13.9* 8.8 7.1 8.3  HGB 15.7* 11.4* 12.0 12.3  HCT 47.0* 34.5* 35.4* 36.4  MCV 90.6 90.6 89.6 89.0  PLT 171 152 189 580   Basic Metabolic Panel: Recent Labs  Lab 08/05/18 0404 08/06/18 0442 08/07/18 0326 08/08/18 0335 08/09/18 0228  NA 137 140 138 136 134*  K 3.5 3.6 3.8 3.7 3.8  CL 109 107 106 102 101  CO2 20* 23 24 24 24   GLUCOSE 98 100* 102* 103* 106*  BUN 10 8 6* 11 9  CREATININE 0.81 0.73 0.72 0.71 0.75  CALCIUM 8.4* 8.8* 9.0 9.6 9.2  MG  --   --  1.8  --  1.8   BNP (last 3 results) Recent Labs    08/03/18 2111  BNP 193.3*   CBG: Recent Labs  Lab 08/03/18 1615  GLUCAP 110*   Time spent: 35 minutes  Signed:  Berle Mull  [facility]  08/09/2018  ,  1:19 PM

## 2018-08-09 NOTE — Progress Notes (Addendum)
Clinical Social Worker facilitated patient discharge including contacting patient family and facility to confirm patient discharge plans.  Clinical information faxed to facility and family agreeable with plan. CSW arranged ambulance transport via [facility] to [facility] .  RN to call for report prior to discharge [phone] Ext:[phone].  Clinical Social Worker will sign off for now as social work intervention is no longer needed. Please consult [place] again if new need arises.  [employer] [phone]

## 2018-12-17 ENCOUNTER — Other Ambulatory Visit: Payer: Self-pay | Admitting: General Surgery

## 2018-12-17 DIAGNOSIS — Z853 Personal history of malignant neoplasm of breast: Secondary | ICD-10-CM

## 2019-01-22 ENCOUNTER — Ambulatory Visit
Admission: RE | Admit: 2019-01-22 | Discharge: 2019-01-22 | Disposition: A | Discharge status: Home / Self Care | Payer: Medicare Other | Source: Ambulatory Visit | Attending: General Surgery | Admitting: General Surgery

## 2019-01-22 ENCOUNTER — Other Ambulatory Visit: Payer: Self-pay

## 2019-01-22 DIAGNOSIS — Z853 Personal history of malignant neoplasm of breast: Secondary | ICD-10-CM

## 2019-02-27 ENCOUNTER — Telehealth: Payer: Self-pay | Admitting: Cardiology

## 2019-02-27 NOTE — Telephone Encounter (Signed)
Virtual Visit Pre-Appointment Phone Call  Steps For Call:  1. Confirm consent - "In the setting of the current Covid19 crisis, you are scheduled for a (phone or video) visit with your provider on (date) at (time).  Just as we do with many in-office visits, in order for you to participate in this visit, we must obtain consent.  If you'd like, I can send this to your mychart (if signed up) or email for you to review.  Otherwise, I can obtain your verbal consent now.  All virtual visits are billed to your insurance company just like a normal visit would be.  By agreeing to a virtual visit, we'd like you to understand that the technology does not allow for your provider to perform an examination, and thus may limit your provider's ability to fully assess your condition.  Finally, though the technology is pretty good, we cannot assure that it will always work on either your or our end, and in the setting of a video visit, we may have to convert it to a phone-only visit.  In either situation, we cannot ensure that we have a secure connection.  Are you willing to proceed?" STAFF: Did the patient verbally acknowledge consent to telehealth visit? Document YES/NO here: YES  2. Confirm the BEST phone number to call the day of the visit by including in appointment notes  3. Give patient instructions for WebEx/MyChart download to smartphone as below or Doximity/Doxy.me if video visit (depending on what platform provider is using)  4. Advise patient to be prepared with their blood pressure, heart rate, weight, any heart rhythm information, their current medicines, and a piece of paper and pen handy for any instructions they may receive the day of their visit  5. Inform patient they will receive a phone call 15 minutes prior to their appointment time (may be from unknown caller ID) so they should be prepared to answer  6. Confirm that appointment type is correct in Epic appointment notes (VIDEO vs  PHONE)     TELEPHONE CALL NOTE  FLORIE CARICO has been deemed a candidate for a follow-up tele-health visit to limit community exposure during the Covid-19 pandemic. I spoke with the patient via phone to ensure availability of phone/video source, confirm preferred email & phone number, and discuss instructions and expectations.  I reminded Teddy Spike to be prepared with any vital sign and/or heart rhythm information that could potentially be obtained via home monitoring, at the time of her visit. I reminded JAEDEN WESTBAY to expect a phone call at the time of her visit if her visit.  Isaiah Blakes 02/27/2019 9:48 AM   INSTRUCTIONS FOR DOWNLOADING THE WEBEX APP TO SMARTPHONE  - If Apple, ask patient to go to App Store and type in WebEx in the search bar. [employer], the blue/green circle. If Android, go to [employer] and type in [employer] in the search bar. The app is free but as with any other app downloads, their phone may require them to verify saved payment information or Apple/Android password.  - The patient does NOT have to create an account. - On the day of the visit, the assist will walk the patient through joining the meeting with the meeting number/password.  INSTRUCTIONS FOR DOWNLOADING THE MYCHART APP TO SMARTPHONE  - The patient must first make sure to have activated MyChart and know their login information - If Apple, go to [employer] and type in  MyChart in the search bar and download the app. If Android, ask patient to go to [employer] and type in [employer] in the search bar and download the app. The app is free but as with any other app downloads, their phone may require them to verify saved payment information or Apple/Android password.  - The patient will need to then log into the app with their MyChart username and password, and select [facility] as their healthcare provider to link the account. When it is time for your visit,  go to the MyChart app, find appointments, and click Begin Video Visit. Be sure to Select Allow for your device to access the Microphone and Camera for your visit. You will then be connected, and your provider will be with you shortly.  **If they have any issues connecting, or need assistance please contact MyChart service desk [phone])**  **If using a computer, in order to ensure the best quality for their visit they will need to use either of the following Internet Browsers: [employer], or Google Chrome**  IF USING DOXIMITY or DOXY.ME - The patient will receive a link just prior to their visit, either by text or email (to be determined day of appointment depending on if it's doxy.me or Doximity).     FULL LENGTH CONSENT FOR TELE-HEALTH VISIT   I hereby voluntarily request, consent and authorize [facility] and its employed or contracted physicians, physician assistants, nurse practitioners or other licensed health care professionals (the Practitioner), to provide me with telemedicine health care services (the Services") as deemed necessary by the treating Practitioner. I acknowledge and consent to receive the Services by the Practitioner via telemedicine. I understand that the telemedicine visit will involve communicating with the Practitioner through live audiovisual communication technology and the disclosure of certain medical information by electronic transmission. I acknowledge that I have been given the opportunity to request an in-person assessment or other available alternative prior to the telemedicine visit and am voluntarily participating in the telemedicine visit.  I understand that I have the right to withhold or withdraw my consent to the use of telemedicine in the course of my care at any time, without affecting my right to future care or treatment, and that the Practitioner or I may terminate the telemedicine visit at any time. I understand that I have the  right to inspect all information obtained and/or recorded in the course of the telemedicine visit and may receive copies of available information for a reasonable fee.  I understand that some of the potential risks of receiving the Services via telemedicine include:   Delay or interruption in medical evaluation due to technological equipment failure or disruption;  Information transmitted may not be sufficient (e.g. poor resolution of images) to allow for appropriate medical decision making by the Practitioner; and/or   In rare instances, security protocols could fail, causing a breach of personal health information.  Furthermore, I acknowledge that it is my responsibility to provide information about my medical history, conditions and care that is complete and accurate to the best of my ability. I acknowledge that Practitioner's advice, recommendations, and/or decision may be based on factors not within their control, such as incomplete or inaccurate data provided by me or distortions of diagnostic images or specimens that may result from electronic transmissions. I understand that the practice of medicine is not an exact science and that Practitioner makes no warranties or guarantees regarding treatment outcomes. I acknowledge that I will receive a copy  of this consent concurrently upon execution via email to the email address I last provided but may also request a printed copy by calling the office of [facility].    I understand that my insurance will be billed for this visit.   I have read or had this consent read to me.  I understand the contents of this consent, which adequately explains the benefits and risks of the Services being provided via telemedicine.   I have been provided ample opportunity to ask questions regarding this consent and the Services and have had my questions answered to my satisfaction.  I give my informed consent for the services to be provided through the use of  telemedicine in my medical care  By participating in this telemedicine visit I agree to the above.

## 2019-03-02 ENCOUNTER — Other Ambulatory Visit: Payer: Self-pay

## 2019-03-03 ENCOUNTER — Telehealth: Payer: Medicare Other | Admitting: Cardiology

## 2019-03-16 ENCOUNTER — Encounter: Payer: Self-pay | Admitting: Cardiology

## 2019-03-16 ENCOUNTER — Other Ambulatory Visit: Payer: Self-pay

## 2019-03-16 ENCOUNTER — Telehealth (INDEPENDENT_AMBULATORY_CARE_PROVIDER_SITE_OTHER): Payer: Medicare Other | Admitting: Cardiology

## 2019-03-16 VITALS — BP 137/92 | HR 91

## 2019-03-16 DIAGNOSIS — I10 Essential (primary) hypertension: Secondary | ICD-10-CM

## 2019-03-16 DIAGNOSIS — I5181 Takotsubo syndrome: Secondary | ICD-10-CM | POA: Diagnosis not present

## 2019-03-16 DIAGNOSIS — I2510 Atherosclerotic heart disease of native coronary artery without angina pectoris: Secondary | ICD-10-CM

## 2019-03-16 NOTE — Progress Notes (Signed)
Virtual Visit via Video Note   This visit type was conducted due to national recommendations for restrictions regarding the COVID-19 Pandemic (e.g. social distancing) in an effort to limit this patient's exposure and mitigate transmission in our community.  Due to her co-morbid illnesses, this patient is at least at moderate risk for complications without adequate follow up.  This format is felt to be most appropriate for this patient at this time.  All issues noted in this document were discussed and addressed.  A limited physical exam was performed with this format.  Please refer to the patient's chart for her consent to telehealth for [facility].  Evaluation Performed:  Follow-up visit  This visit type was conducted due to national recommendations for restrictions regarding the COVID-19 Pandemic (e.g. social distancing).  This format is felt to be most appropriate for this patient at this time.  All issues noted in this document were discussed and addressed.  No physical exam was performed (except for noted visual exam findings with Video Visits).  Please refer to the patient's chart (MyChart message for video visits and phone note for telephone visits) for the patient's consent to telehealth for [facility].  Date:  03/16/2019  ID: [name], DOB 1927-09-12, MRN [id number]   Patient Location: [place] [address]   Provider location:   [place] Office  PCP:  Myrlene Broker, MD  Cardiologist:  Jenne Campus, MD     Chief Complaint: Problem with my back  History of Present Illness:    [name] is a 85 y.o. female  who presents via audio/video conferencing for a telehealth visit today.  Past medical history significant for cardiomyopathy which is most likely stress induced.  Last echocardiogram from summer of last year showed normal left ventricular ejection fraction, hypertension, remote history of myocardial infarction,  also failure to thrive.  Few weeks ago she fell down and she injured her back after that she had a going to the emergency room x-ray was done she was identified to have some old compression fracture after that she was sent to orthopedic doctor with consideration for possibly kyphoplasty however she was disqualified as potential candidate because of poor condition overall she spent time in the diet in the matter-of-fact when I talked to her she is laying down in the bed and her daughter Freda Munro helps to be talking to her.  No cardiac complaints no chest pain, tightness, pressure, burning in the chest overall cardiac wise seems to be doing fine.   The patient does not have symptoms concerning for COVID-19 infection (fever, chills, cough, or new SHORTNESS OF BREATH).    Prior CV studies:   The following studies were reviewed today:       Past Medical History:  Diagnosis Date  . Anemia    "when she was a child"  . Anxiety   . Cough    "chronic cough"  . GERD (gastroesophageal reflux disease)   . Heart murmur    in the past not sure if recent  . History of acute bronchitis   . HTN (hypertension)   . Hx of bladder infections   . Myocardial infarction [facility])    "heart attack from Takotsubo no damage according to cath" in 2010  . Peripheral neuropathy   . Seizure disorder ([place])   . Seizures ([place])    last seizure 2010  . Takotsubo cardiomyopathy     Past Surgical History:  Procedure  Laterality Date  . ABDOMINAL HYSTERECTOMY    . BREAST LUMPECTOMY Left    benign  . BREAST LUMPECTOMY WITH RADIOACTIVE SEED LOCALIZATION Right 01/23/2017   Procedure: RADIOACTIVE SEED GUIDED RIGHT BREAST LUMPECTOMY;  Surgeon: Rolm Bookbinder, MD;  Location: [facility];  Service: General;  Laterality: Right;  . CARDIAC CATHETERIZATION  2010   No significant CAD. stress induced cardiomyopathy.   . CARPAL TUNNEL RELEASE Bilateral    2014  . CATARACT EXTRACTION W/ INTRAOCULAR LENS IMPLANT Bilateral   .  CHOLECYSTECTOMY    . LAPAROSCOPIC ABDOMINAL EXPLORATION    . TONSILLECTOMY       Current Meds  Medication Sig  . acetaminophen (TYLENOL) 500 MG tablet Take 1,000 mg by mouth every 6 (six) hours as needed (for pain/headaches.).   Marland Kitchen albuterol (PROVENTIL) (2.5 MG/3ML) 0.083% nebulizer solution Take 2.5 mg by nebulization every 6 (six) hours as needed for wheezing.   . bethanechol (URECHOLINE) 10 MG tablet Take 10 mg by mouth daily with breakfast.   . budesonide-formoterol (SYMBICORT) 80-4.5 MCG/ACT inhaler Inhale 2 puffs into the lungs daily.   . Carboxymethylcellulose Sod PF (REFRESH PLUS) 0.5 % SOLN Place 1 drop into both eyes 4 (four) times daily as needed (dry eyes).   . Carboxymethylcellulose Sodium (REFRESH LIQUIGEL) 1 % GEL Place 1 application into both eyes See admin instructions. Instill under eyelid of both eyes every other night  . carvedilol (COREG) 3.125 MG tablet Take 1 tablet (3.125 mg total) by mouth 2 (two) times daily with a meal.  . Cholecalciferol (VITAMIN D3) 2000 units TABS Take 2,000 Units by mouth daily with breakfast.   . clonazePAM (KLONOPIN) 0.5 MG tablet Take 1 tablet (0.5 mg total) by mouth 2 (two) times daily with a meal. To prevent breakthrough seizures  . clotrimazole-betamethasone (LOTRISONE) cream Apply 1 application topically 2 (two) times daily as needed (for vaginal discomfort).   . donepezil (ARICEPT) 5 MG tablet Take 1 tablet (5 mg total) by mouth daily with supper.  . esomeprazole (NEXIUM) 40 MG capsule Take 40 mg by mouth daily with breakfast.   . estradiol (ESTRACE) 0.1 MG/GM vaginal cream Place 1 Applicatorful vaginally at bedtime as needed (for vaginal discomfort (dryness/itchy)).   . feeding supplement, ENSURE ENLIVE, (ENSURE ENLIVE) LIQD Take 237 mLs by mouth 2 (two) times daily between meals.  Marland Kitchen GLUCOSAMINE-CHONDROITIN PO Take 1 tablet by mouth 2 (two) times daily with a meal.  . guaiFENesin (MUCINEX) 600 MG 12 hr tablet Take 1 tablet (600 mg total)  by mouth 2 (two) times daily.  Marland Kitchen guaifenesin (ROBITUSSIN) 100 MG/5ML syrup Take 200 mg by mouth 3 (three) times daily as needed for cough.  . hydrocortisone (ANUSOL-HC) 2.5 % rectal cream Place 1 application rectally 2 (two) times daily as needed for hemorrhoids or itching.   . hydrocortisone cream 1 % Apply topically 2 (two) times daily.  Marland Kitchen ipratropium-albuterol (DUONEB) 0.5-2.5 (3) MG/3ML SOLN Take 3 mLs by nebulization 3 (three) times daily for 5 days.  Marland Kitchen levETIRAcetam (KEPPRA) 750 MG tablet Take 750 mg by mouth 2 (two) times daily with a meal. For seizures  . levocetirizine (XYZAL) 5 MG tablet Take 5 mg by mouth daily with breakfast. Once a day as needed   . memantine (NAMENDA) 5 MG tablet Take 1 tablet (5 mg total) by mouth daily with supper.  . montelukast (SINGULAIR) 5 MG chewable tablet Chew 5 mg by mouth daily with supper.   . Multiple Vitamin (MULTIVITAMIN WITH MINERALS) TABS tablet Take 1  tablet by mouth daily with supper.  . Multiple Vitamins-Minerals (PRESERVISION AREDS 2 PO) Take 1 capsule by mouth 2 (two) times daily with a meal.   . Omega-3 Fatty Acids (FISH OIL) 1000 MG CAPS Take 1,000 mg by mouth 2 (two) times daily with a meal.  . PARoxetine (PAXIL) 10 MG tablet Take 1 tablet (10 mg total) by mouth daily at 2 PM.  . pravastatin (PRAVACHOL) 20 MG tablet Take 1 tablet (20 mg total) by mouth daily at 6 PM. (Patient taking differently: Take 20 mg by mouth daily with supper. )  . pregabalin (LYRICA) 50 MG capsule Take 1 capsule (50 mg total) by mouth 2 (two) times daily with a meal.  . Probiotic Product (PROBIOTIC FORMULA) CAPS Take 1 capsule by mouth daily with breakfast.   . silodosin (RAPAFLO) 4 MG CAPS capsule Take 4 mg by mouth daily with breakfast.   . telmisartan (MICARDIS) 40 MG tablet Take 40 mg by mouth daily with breakfast.   . tiotropium (SPIRIVA) 18 MCG inhalation capsule Place 18 mcg into inhaler and inhale at bedtime.      Family History: The patient's family  history includes Arthritis in her mother; Cancer in her mother and another family member; Heart attack in her father and another family member; Heart failure in an other family member.   ROS:   Please see the history of present illness.     All other systems reviewed and are negative.   Labs/Other Tests and Data Reviewed:     Recent Labs: 08/03/2018: B Natriuretic Peptide 193.3 08/09/2018: BUN 9; Creatinine, Ser 0.75; Hemoglobin 12.3; Magnesium 1.8; Platelets 282; Potassium 3.8; Sodium 134  Recent Lipid Panel No results found for: CHOL, TRIG, HDL, CHOLHDL, VLDL, LDLCALC, LDLDIRECT    Exam:    Vital Signs:  BP (!) 137/92   Pulse 91     Wt Readings from Last 3 Encounters:  08/03/18 134 lb (60.8 kg)  07/03/18 139 lb (63 kg)  06/11/17 132 lb (59.9 kg)     Well nourished, well developed in no acute distress. Alert awake oriented x3 fragile looking laying down in the bed denies having any complaint at the moment she said when she lay in the bed she is comfortable when she sits in the chair her back will bother her a lot  Diagnosis for this visit:   1. Takotsubo cardiomyopathy   2. Essential hypertension   3. Coronary artery disease involving native coronary artery of native heart without angina pectoris      ASSESSMENT & PLAN:    1.  History of stress-induced cardiomyopathy with normalization last echocardiogram of summer showed normal left ventricular ejection fraction.  We will continue present medications. 2.  Essential hypertension doing well from that point review blood pressure well controlled. 3.  Coronary artery disease without any angina pectoris.  Stable on appropriate medications. 4.  Failure to try if she is spending time laying in the bed I encouraged her daughter to try to make sure she has sufficient nutrients intake.  I recommended Ensure or boost also if she wants protein shake she should get it if she wants ice and she should get it hopefully will be able to  increase her stamina by doing this she will do better overall.  We also talked about exercises I advised her to get her mother out of bed may be to 3 times a day and sit up in the chair.  COVID-19 Education: The signs and symptoms of  COVID-19 were discussed with the patient and how to seek care for testing (follow up with PCP or arrange E-visit).  The importance of social distancing was discussed today.  Patient Risk:   After full review of this patients clinical status, I feel that they are at least moderate risk at this time.  Time:   Today, I have spent 16 minutes with the patient with telehealth technology discussing pt health issues.  I spent 56minutes reviewing her chart before the visit.  Visit was finished at 1:38 PM.    Medication Adjustments/Labs and Tests Ordered: Current medicines are reviewed at length with the patient today.  Concerns regarding medicines are outlined above.  No orders of the defined types were placed in this encounter.  Medication changes: No orders of the defined types were placed in this encounter.    Disposition: Follow-up in 3 months  Signed, Park Liter, MD, [facility] 03/16/2019 1:39 PM    [place]

## 2019-03-16 NOTE — Patient Instructions (Signed)
Medication Instructions:  Your physician recommends that you continue on your current medications as directed. Please refer to the Current Medication list given to you today.  If you need a refill on your cardiac medications before your next appointment, please call your pharmacy.   Lab work: None.  If you have labs (blood work) drawn today and your tests are completely normal, you will receive your results only by: . MyChart Message (if you have MyChart) OR . A paper copy in the mail If you have any lab test that is abnormal or we need to change your treatment, we will call you to review the results.  Testing/Procedures: None.   Follow-Up: At [facility], you and your health needs are our priority.  As part of our continuing mission to provide you with exceptional heart care, we have created designated Provider Care Teams.  These Care Teams include your primary Cardiologist (physician) and Advanced Practice Providers (APPs -  Physician Assistants and Nurse Practitioners) who all work together to provide you with the care you need, when you need it. You will need a follow up appointment in 3 months.  Please call our office 2 months in advance to schedule this appointment.  You may see No primary care provider on file. or another member of our [facility] Provider Team in [place]: Brian Munley, MD . Rajan Revankar, MD  Any Other Special Instructions Will Be Listed Below (If Applicable).     

## 2019-06-28 ENCOUNTER — Other Ambulatory Visit: Payer: Self-pay | Admitting: Cardiology

## 2019-07-02 ENCOUNTER — Telehealth: Payer: Medicare Other | Admitting: Cardiology

## 2019-07-09 ENCOUNTER — Encounter: Payer: Self-pay | Admitting: Cardiology

## 2019-07-09 ENCOUNTER — Other Ambulatory Visit: Payer: Self-pay

## 2019-07-09 ENCOUNTER — Telehealth (INDEPENDENT_AMBULATORY_CARE_PROVIDER_SITE_OTHER): Payer: Medicare Other | Admitting: Cardiology

## 2019-07-09 ENCOUNTER — Telehealth: Payer: Self-pay | Admitting: Cardiology

## 2019-07-09 VITALS — BP 122/59 | HR 74

## 2019-07-09 DIAGNOSIS — I10 Essential (primary) hypertension: Secondary | ICD-10-CM

## 2019-07-09 DIAGNOSIS — I5181 Takotsubo syndrome: Secondary | ICD-10-CM

## 2019-07-09 DIAGNOSIS — E785 Hyperlipidemia, unspecified: Secondary | ICD-10-CM

## 2019-07-09 NOTE — Telephone Encounter (Signed)
Virtual Visit Pre-Appointment Phone Call  "(Name), I am calling you today to discuss your upcoming appointment. We are currently trying to limit exposure to the virus that causes COVID-19 by seeing patients at home rather than in the office."  1. "What is the BEST phone number to call the day of the visit?" - include this in appointment notes  2. Do you have or have access to (through a family member/friend) a smartphone with video capability that we can use for your visit?" a. If yes - list this number in appt notes as cell (if different from BEST phone #) and list the appointment type as a VIDEO visit in appointment notes b. If no - list the appointment type as a PHONE visit in appointment notes  3. Confirm consent - "In the setting of the current Covid19 crisis, you are scheduled for a (phone or video) visit with your provider on (date) at (time).  Just as we do with many in-office visits, in order for you to participate in this visit, we must obtain consent.  If you'd like, I can send this to your mychart (if signed up) or email for you to review.  Otherwise, I can obtain your verbal consent now.  All virtual visits are billed to your insurance company just like a normal visit would be.  By agreeing to a virtual visit, we'd like you to understand that the technology does not allow for your provider to perform an examination, and thus may limit your provider's ability to fully assess your condition. If your provider identifies any concerns that need to be evaluated in person, we will make arrangements to do so.  Finally, though the technology is pretty good, we cannot assure that it will always work on either your or our end, and in the setting of a video visit, we may have to convert it to a phone-only visit.  In either situation, we cannot ensure that we have a secure connection.  Are you willing to proceed?" STAFF: Did the patient verbally acknowledge consent to telehealth visit? Document  YES/NO here: yes  4. Advise patient to be prepared - "Two hours prior to your appointment, go ahead and check your blood pressure, pulse, oxygen saturation, and your weight (if you have the equipment to check those) and write them all down. When your visit starts, your provider will ask you for this information. If you have an Apple Watch or Kardia device, please plan to have heart rate information ready on the day of your appointment. Please have a pen and paper handy nearby the day of the visit as well."  5. Give patient instructions for MyChart download to smartphone OR Doximity/Doxy.me as below if video visit (depending on what platform provider is using)  6. Inform patient they will receive a phone call 15 minutes prior to their appointment time (may be from unknown caller ID) so they should be prepared to answer    TELEPHONE CALL NOTE  SHAMARA RENNIE has been deemed a candidate for a follow-up tele-health visit to limit community exposure during the Covid-19 pandemic. I spoke with the patient via phone to ensure availability of phone/video source, confirm preferred email & phone number, and discuss instructions and expectations.  I reminded Teddy Spike to be prepared with any vital sign and/or heart rhythm information that could potentially be obtained via home monitoring, at the time of her visit. I reminded KENDALYN DIMOFF to expect a phone call prior to  her visit.  Calla Kicks 07/09/2019 7:46 AM   INSTRUCTIONS FOR DOWNLOADING THE MYCHART APP TO SMARTPHONE  - The patient must first make sure to have activated MyChart and know their login information - If Apple, go to [employer] and type in MyChart in the search bar and download the app. If Android, ask patient to go to [employer] and type in [place] in the search bar and download the app. The app is free but as with any other app downloads, their phone may require them to verify saved payment information or  Apple/Android password.  - The patient will need to then log into the app with their MyChart username and password, and select [place] as their healthcare provider to link the account. When it is time for your visit, go to the MyChart app, find appointments, and click Begin Video Visit. Be sure to Select Allow for your device to access the Microphone and Camera for your visit. You will then be connected, and your provider will be with you shortly.  **If they have any issues connecting, or need assistance please contact MyChart service desk [phone])**  **If using a computer, in order to ensure the best quality for their visit they will need to use either of the following Internet Browsers: [employer], or Google Chrome**  IF USING DOXIMITY or DOXY.ME - The patient will receive a link just prior to their visit by text.     FULL LENGTH CONSENT FOR TELE-HEALTH VISIT   I hereby voluntarily request, consent and authorize [place] [facility] and its employed or contracted physicians, physician assistants, nurse practitioners or other licensed health care professionals (the Practitioner), to provide me with telemedicine health care services (the Services") as deemed necessary by the treating Practitioner. I acknowledge and consent to receive the Services by the Practitioner via telemedicine. I understand that the telemedicine visit will involve communicating with the Practitioner through live audiovisual communication technology and the disclosure of certain medical information by electronic transmission. I acknowledge that I have been given the opportunity to request an in-person assessment or other available alternative prior to the telemedicine visit and am voluntarily participating in the telemedicine visit.  I understand that I have the right to withhold or withdraw my consent to the use of telemedicine in the course of my care at any time, without affecting my right to future care  or treatment, and that the Practitioner or I may terminate the telemedicine visit at any time. I understand that I have the right to inspect all information obtained and/or recorded in the course of the telemedicine visit and may receive copies of available information for a reasonable fee.  I understand that some of the potential risks of receiving the Services via telemedicine include:   Delay or interruption in medical evaluation due to technological equipment failure or disruption;  Information transmitted may not be sufficient (e.g. poor resolution of images) to allow for appropriate medical decision making by the Practitioner; and/or   In rare instances, security protocols could fail, causing a breach of personal health information.  Furthermore, I acknowledge that it is my responsibility to provide information about my medical history, conditions and care that is complete and accurate to the best of my ability. I acknowledge that Practitioner's advice, recommendations, and/or decision may be based on factors not within their control, such as incomplete or inaccurate data provided by me or distortions of diagnostic images or specimens that may result from electronic transmissions. I  understand that the practice of medicine is not an exact science and that Practitioner makes no warranties or guarantees regarding treatment outcomes. I acknowledge that I will receive a copy of this consent concurrently upon execution via email to the email address I last provided but may also request a printed copy by calling the office of [place].    I understand that my insurance will be billed for this visit.   I have read or had this consent read to me.  I understand the contents of this consent, which adequately explains the benefits and risks of the Services being provided via telemedicine.   I have been provided ample opportunity to ask questions regarding this consent and the Services and have had  my questions answered to my satisfaction.  I give my informed consent for the services to be provided through the use of telemedicine in my medical care  By participating in this telemedicine visit I agree to the above.

## 2019-07-09 NOTE — Patient Instructions (Signed)
Medication Instructions:  Your physician recommends that you continue on your current medications as directed. Please refer to the Current Medication list given to you today.  If you need a refill on your cardiac medications before your next appointment, please call your pharmacy.   Lab work: None.  If you have labs (blood work) drawn today and your tests are completely normal, you will receive your results only by: . MyChart Message (if you have MyChart) OR . A paper copy in the mail If you have any lab test that is abnormal or we need to change your treatment, we will call you to review the results.  Testing/Procedures: None.   Follow-Up: At [facility], you and your health needs are our priority.  As part of our continuing mission to provide you with exceptional heart care, we have created designated Provider Care Teams.  These Care Teams include your primary Cardiologist (physician) and Advanced Practice Providers (APPs -  Physician Assistants and Nurse Practitioners) who all work together to provide you with the care you need, when you need it. You will need a follow up appointment in 5 months.  Please call our office 2 months in advance to schedule this appointment.  You may see No primary care provider on file. or another member of our [facility] Provider Team in [place]: Brian Munley, MD . Rajan Revankar, MD  Any Other Special Instructions Will Be Listed Below (If Applicable).    

## 2019-07-09 NOTE — Progress Notes (Signed)
Virtual Visit via Video Note   This visit type was conducted due to national recommendations for restrictions regarding the COVID-19 Pandemic (e.g. social distancing) in an effort to limit this patient's exposure and mitigate transmission in our community.  Due to her co-morbid illnesses, this patient is at least at moderate risk for complications without adequate follow up.  This format is felt to be most appropriate for this patient at this time.  All issues noted in this document were discussed and addressed.  A limited physical exam was performed with this format.  Please refer to the patient's chart for her consent to telehealth for [facility].  Evaluation Performed:  Follow-up visit  This visit type was conducted due to national recommendations for restrictions regarding the COVID-19 Pandemic (e.g. social distancing).  This format is felt to be most appropriate for this patient at this time.  All issues noted in this document were discussed and addressed.  No physical exam was performed (except for noted visual exam findings with Video Visits).  Please refer to the patient's chart (MyChart message for video visits and phone note for telephone visits) for the patient's consent to telehealth for [facility].  Date:  07/09/2019  ID: [name], DOB 07/06/1927, MRN [id number]   Patient Location: [place] [address]   Provider location:   [place] Office  PCP:  Myrlene Broker, MD  Cardiologist:  Jenne Campus, MD     Chief Complaint: Doing well cardiac wise  History of Present Illness:    [name] is a 85 y.o. female  who presents via audio/video conferencing for a telehealth visit today.  History of cardiomyopathy with normalization based on last echocardiogram done last year.  Recently she sustained some compression fracture of her spine a lot of pain but likely she was able to go through kyphoplasty since that time she  is doing well she does not have much pain in her back.  Overall there is improvement the way she is she staying at home and her daughter [name] who is also my patient taking excellent care of her.  Overall she is doing well cardiac wise no complaints   The patient does not have symptoms concerning for COVID-19 infection (fever, chills, cough, or new SHORTNESS OF BREATH).    Prior CV studies:   The following studies were reviewed today:       Past Medical History:  Diagnosis Date   Anemia    "when she was a child"   Anxiety    Cough    "chronic cough"   GERD (gastroesophageal reflux disease)    Heart murmur    in the past not sure if recent   History of acute bronchitis    HTN (hypertension)    Hx of bladder infections    Myocardial infarction ([place])    "heart attack from Takotsubo no damage according to cath" in 2010   Peripheral neuropathy    Seizure disorder ([place])    Seizures ([place])    last seizure 2010   Takotsubo cardiomyopathy     Past Surgical History:  Procedure Laterality Date   ABDOMINAL HYSTERECTOMY     BREAST LUMPECTOMY Left    benign   BREAST LUMPECTOMY WITH RADIOACTIVE SEED LOCALIZATION Right 01/23/2017   Procedure: RADIOACTIVE SEED GUIDED RIGHT BREAST LUMPECTOMY;  Surgeon: Rolm Bookbinder, MD;  Location: [place];  Service: General;  Laterality: Right;   CARDIAC CATHETERIZATION  2010  No significant CAD. stress induced cardiomyopathy.    CARPAL TUNNEL RELEASE Bilateral    2014   CATARACT EXTRACTION W/ INTRAOCULAR LENS IMPLANT Bilateral    CHOLECYSTECTOMY     LAPAROSCOPIC ABDOMINAL EXPLORATION     TONSILLECTOMY       Current Meds  Medication Sig   acetaminophen (TYLENOL) 500 MG tablet Take 1,000 mg by mouth every 6 (six) hours as needed (for pain/headaches.).    albuterol (PROVENTIL) (2.5 MG/3ML) 0.083% nebulizer solution Take 2.5 mg by nebulization every 6 (six) hours as needed for wheezing.    bethanechol (URECHOLINE) 10  MG tablet Take 10 mg by mouth daily with breakfast.    budesonide-formoterol (SYMBICORT) 80-4.5 MCG/ACT inhaler Inhale 2 puffs into the lungs daily.    Carboxymethylcellulose Sod PF (REFRESH PLUS) 0.5 % SOLN Place 1 drop into both eyes 4 (four) times daily as needed (dry eyes).    Carboxymethylcellulose Sodium (REFRESH LIQUIGEL) 1 % GEL Place 1 application into both eyes See admin instructions. Instill under eyelid of both eyes every other night   carvedilol (COREG) 3.125 MG tablet TAKE 1 TABLET BY MOUTH TWICE DAILY WITH A MEAL   Cholecalciferol (VITAMIN D3) 2000 units TABS Take 2,000 Units by mouth daily with breakfast.    clonazePAM (KLONOPIN) 0.5 MG tablet Take 1 tablet (0.5 mg total) by mouth 2 (two) times daily with a meal. To prevent breakthrough seizures   clotrimazole-betamethasone (LOTRISONE) cream Apply 1 application topically 2 (two) times daily as needed (for vaginal discomfort).    donepezil (ARICEPT) 5 MG tablet Take 1 tablet (5 mg total) by mouth daily with supper.   esomeprazole (NEXIUM) 40 MG capsule Take 40 mg by mouth daily with breakfast.    estradiol (ESTRACE) 0.1 MG/GM vaginal cream Place 1 Applicatorful vaginally at bedtime as needed (for vaginal discomfort (dryness/itchy)).    GLUCOSAMINE-CHONDROITIN PO Take 1 tablet by mouth 2 (two) times daily with a meal.   guaiFENesin (MUCINEX) 600 MG 12 hr tablet Take 1 tablet (600 mg total) by mouth 2 (two) times daily.   hydrocortisone (ANUSOL-HC) 2.5 % rectal cream Place 1 application rectally 2 (two) times daily as needed for hemorrhoids or itching.    hydrocortisone cream 1 % Apply topically 2 (two) times daily.   ipratropium-albuterol (DUONEB) 0.5-2.5 (3) MG/3ML SOLN Take 3 mLs by nebulization 3 (three) times daily for 5 days.   levETIRAcetam (KEPPRA) 750 MG tablet Take 750 mg by mouth 2 (two) times daily with a meal. For seizures   levocetirizine (XYZAL) 5 MG tablet Take 5 mg by mouth daily with breakfast. Once a  day as needed    memantine (NAMENDA) 5 MG tablet Take 1 tablet (5 mg total) by mouth daily with supper.   montelukast (SINGULAIR) 5 MG chewable tablet Chew 5 mg by mouth daily with supper.    Multiple Vitamin (MULTIVITAMIN WITH MINERALS) TABS tablet Take 1 tablet by mouth daily with supper.   Multiple Vitamins-Minerals (PRESERVISION AREDS 2 PO) Take 1 capsule by mouth 2 (two) times daily with a meal.    Omega-3 Fatty Acids (FISH OIL) 1000 MG CAPS Take 1,000 mg by mouth 2 (two) times daily with a meal.   PARoxetine (PAXIL) 10 MG tablet Take 1 tablet (10 mg total) by mouth daily at 2 PM.   pravastatin (PRAVACHOL) 20 MG tablet TAKE 1 TABLET BY MOUTH ONCE DAILY AT  6PM   pregabalin (LYRICA) 50 MG capsule Take 1 capsule (50 mg total) by mouth 2 (two) times daily  with a meal. (Patient taking differently: Take 50 mg by mouth daily. )   Probiotic Product (PROBIOTIC FORMULA) CAPS Take 1 capsule by mouth daily with breakfast.    silodosin (RAPAFLO) 4 MG CAPS capsule Take 4 mg by mouth daily with breakfast.    telmisartan (MICARDIS) 40 MG tablet Take 40 mg by mouth daily with breakfast.    tiotropium (SPIRIVA) 18 MCG inhalation capsule Place 18 mcg into inhaler and inhale at bedtime.      Family History: The patient's family history includes Arthritis in her mother; Cancer in her mother and another family member; Heart attack in her father and another family member; Heart failure in an other family member.   ROS:   Please see the history of present illness.     All other systems reviewed and are negative.   Labs/Other Tests and Data Reviewed:     Recent Labs: 08/03/2018: B Natriuretic Peptide 193.3 08/09/2018: BUN 9; Creatinine, Ser 0.75; Hemoglobin 12.3; Magnesium 1.8; Platelets 282; Potassium 3.8; Sodium 134  Recent Lipid Panel No results found for: CHOL, TRIG, HDL, CHOLHDL, VLDL, LDLCALC, LDLDIRECT    Exam:    Vital Signs:  BP (!) 122/59    Pulse 74     Wt Readings from  Last 3 Encounters:  08/03/18 134 lb (60.8 kg)  07/03/18 139 lb (63 kg)  06/11/17 132 lb (59.9 kg)     Well nourished, well developed in no acute distress. Alert awake alert x3 talking to me over the video link.  Her daughter [name] is assisting with this conversation.  Patient does not have any symptoms she is at home I am in our office in [facility].  Diagnosis for this visit:   1. Takotsubo cardiomyopathy   2. Essential hypertension   3. Dyslipidemia      ASSESSMENT & PLAN:    1.  History of Takotsubo with normalization overall hemodynamically stable last echocardiogram showed preserved left ventricular ejection fraction will continue present management. Essential hypertension blood pressure well controlled continue present management. 3.  Dyslipidemia last LDL I have is from year ago so showing LDL 72.  We will continue present management.  COVID-19 Education: The signs and symptoms of COVID-19 were discussed with the patient and how to seek care for testing (follow up with PCP or arrange E-visit).  The importance of social distancing was discussed today.  Patient Risk:   After full review of this patients clinical status, I feel that they are at least moderate risk at this time.  Time:   Today, I have spent 15 minutes with the patient with telehealth technology discussing pt health issues.  I spent 5 minutes reviewing her chart before the visit.  Visit was finished at 4:34 PM.    Medication Adjustments/Labs and Tests Ordered: Current medicines are reviewed at length with the patient today.  Concerns regarding medicines are outlined above.  No orders of the defined types were placed in this encounter.  Medication changes: No orders of the defined types were placed in this encounter.    Disposition: Follow-up 5 months  Signed, Park Liter, MD, [facility] 07/09/2019 [place]

## 2019-08-30 IMAGING — DX DG CHEST 2V
2 series · 2 of 2 positions shown · non-contrast
Comparison: 03/28/2018 and 12/31/2017 radiographs.

CLINICAL DATA: Fever with weakness and shortness of breath.

EXAM:
CHEST - 2 VIEW

[x chest ap]
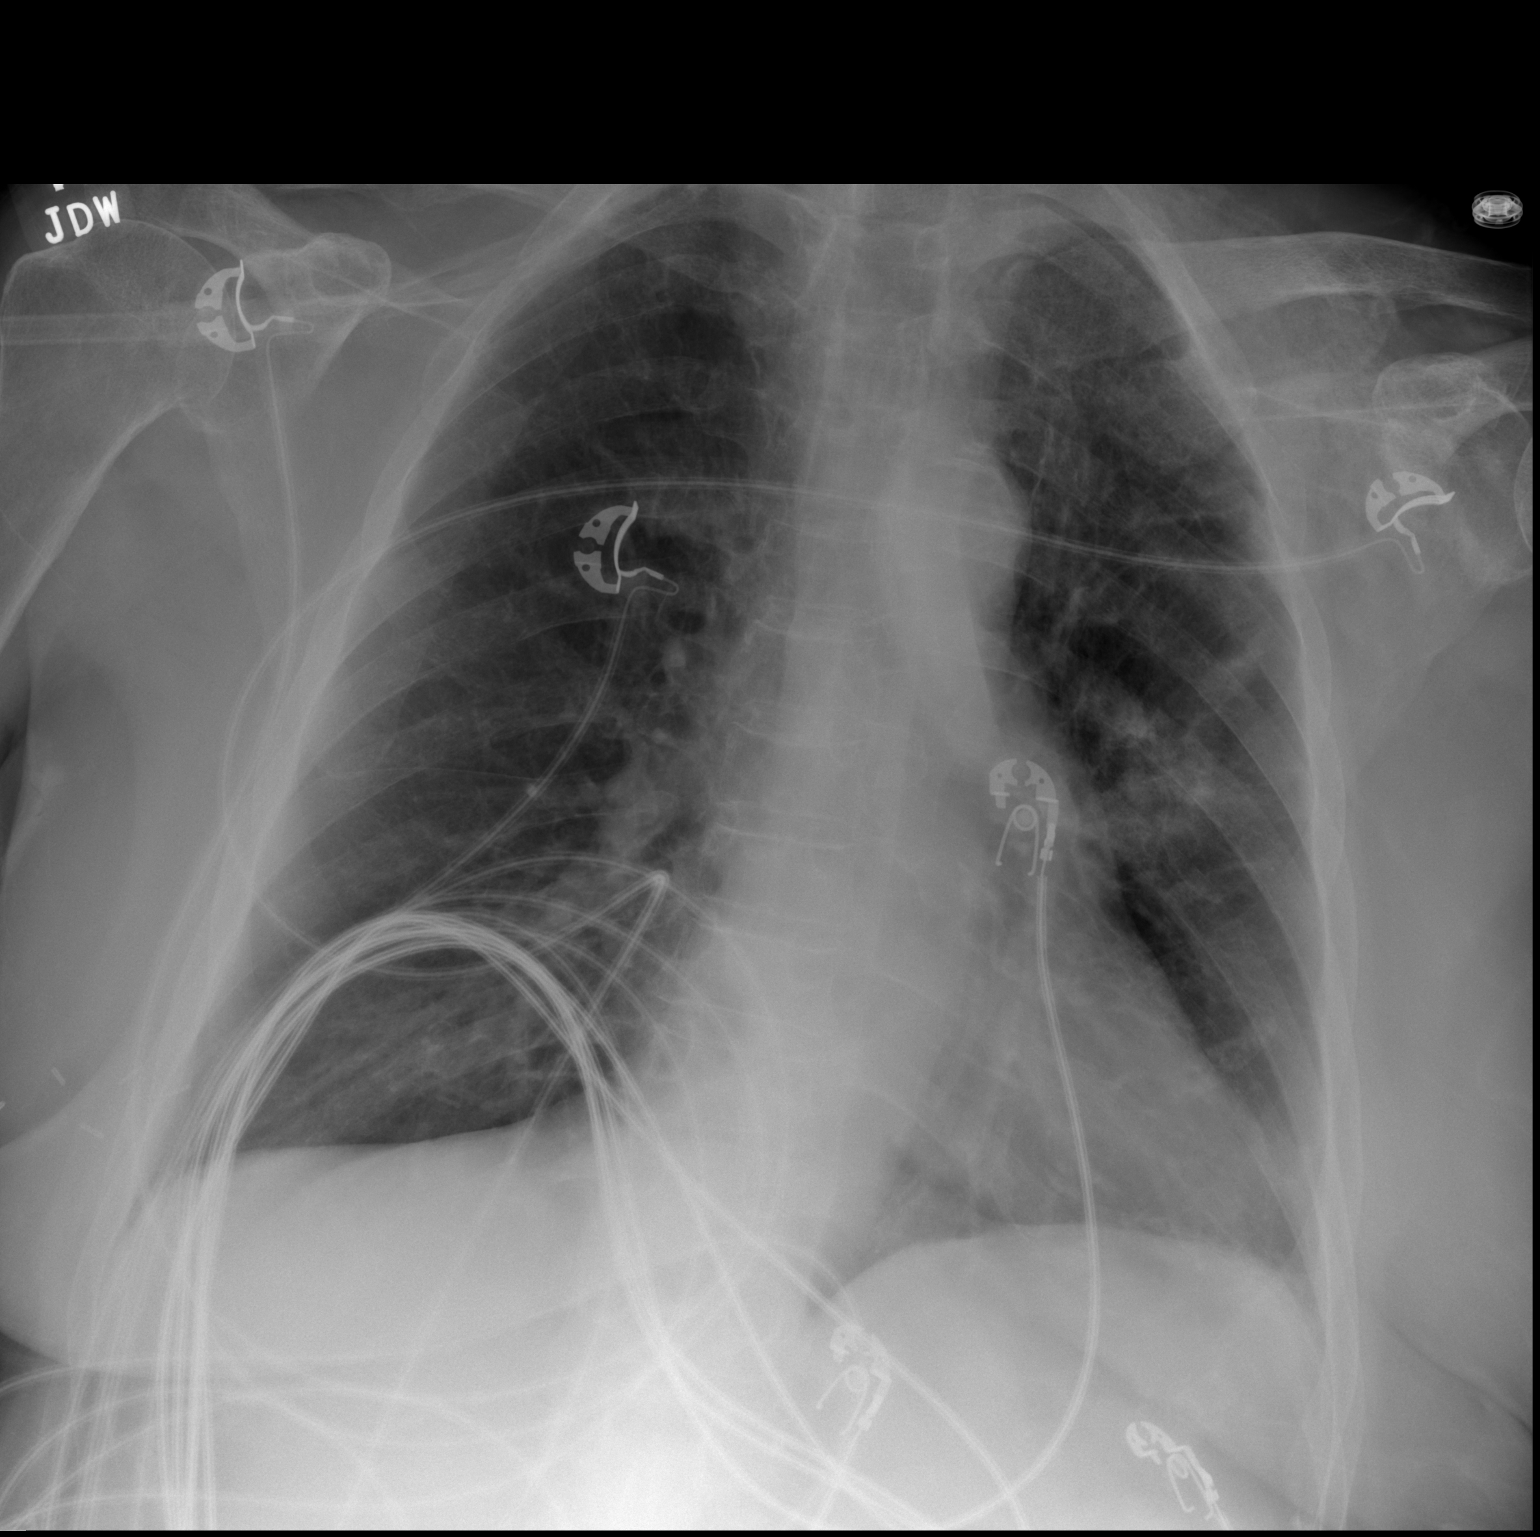

[w chest lat]
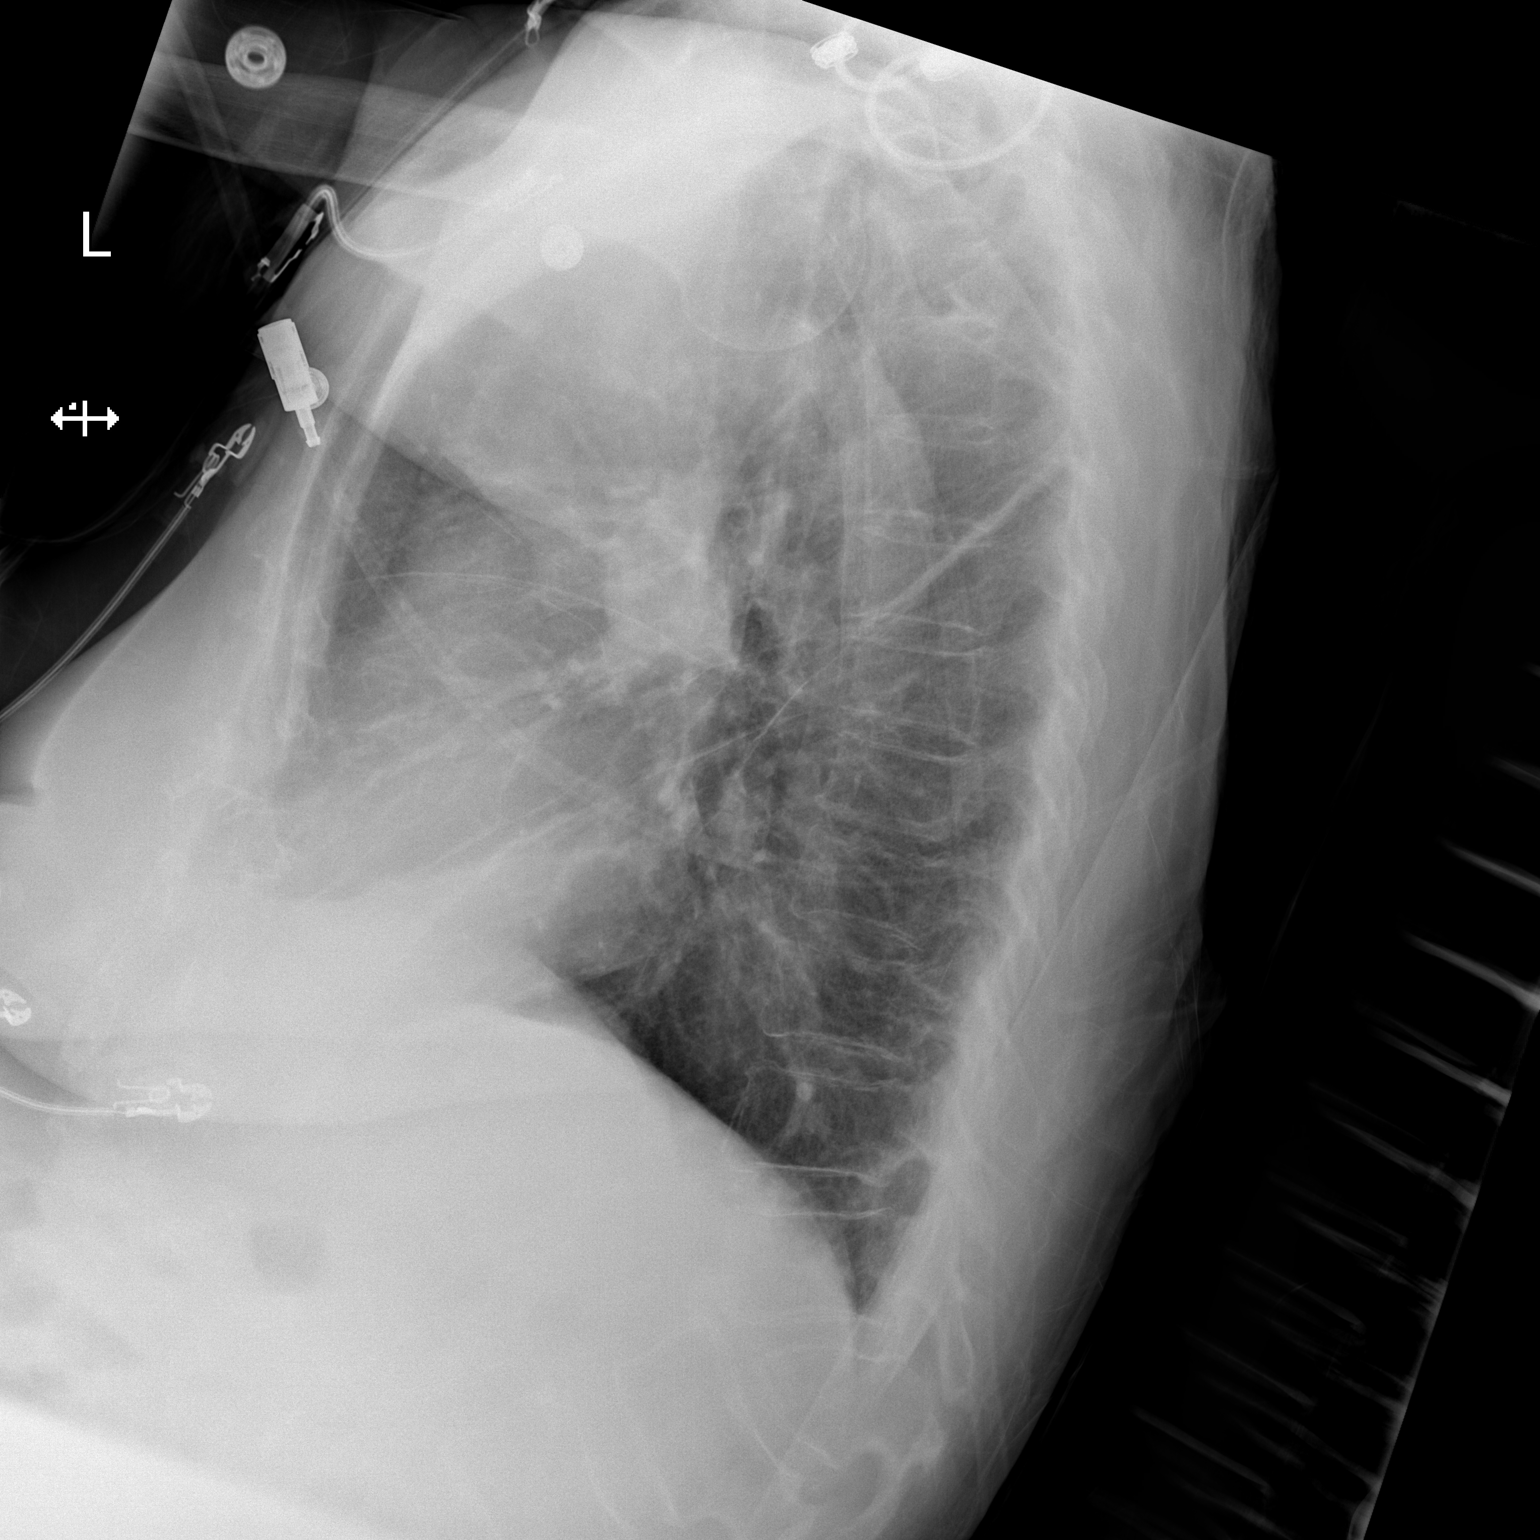

[2 of 2 positions shown; findings below may reference images not displayed]

FINDINGS: The heart size and mediastinal contours are stable allowing for
positional differences. There is aortic atherosclerosis. There is
new left perihilar opacity on the frontal examination, likely
representing pneumonia in this clinical context. The right lung is
clear. There is no pleural effusion or pneumothorax. Vertebral body
anomaly and focal kyphosis near the thoracolumbar junction are again
noted.
IMPRESSION: New left perihilar opacity most consistent with pneumonia in this
clinical context. Followup PA and lateral chest X-ray is recommended
in 3-4 weeks following trial of antibiotic therapy to ensure
resolution and exclude underlying malignancy.

## 2019-10-02 ENCOUNTER — Other Ambulatory Visit: Payer: Self-pay | Admitting: General Surgery

## 2019-10-02 DIAGNOSIS — Z9889 Other specified postprocedural states: Secondary | ICD-10-CM

## 2019-11-11 ENCOUNTER — Ambulatory Visit (INDEPENDENT_AMBULATORY_CARE_PROVIDER_SITE_OTHER): Payer: Medicare Other | Admitting: Cardiology

## 2019-11-11 ENCOUNTER — Ambulatory Visit: Payer: Medicare Other | Admitting: Cardiology

## 2019-11-11 ENCOUNTER — Telehealth: Payer: Self-pay | Admitting: *Deleted

## 2019-11-11 ENCOUNTER — Other Ambulatory Visit: Payer: Self-pay

## 2019-11-11 ENCOUNTER — Encounter: Payer: Self-pay | Admitting: Cardiology

## 2019-11-11 VITALS — BP 116/60 | HR 66 | Ht 59.0 in | Wt 123.0 lb

## 2019-11-11 DIAGNOSIS — I10 Essential (primary) hypertension: Secondary | ICD-10-CM

## 2019-11-11 DIAGNOSIS — Z01818 Encounter for other preprocedural examination: Secondary | ICD-10-CM

## 2019-11-11 DIAGNOSIS — J449 Chronic obstructive pulmonary disease, unspecified: Secondary | ICD-10-CM | POA: Diagnosis not present

## 2019-11-11 DIAGNOSIS — I5181 Takotsubo syndrome: Secondary | ICD-10-CM

## 2019-11-11 NOTE — Telephone Encounter (Signed)
   [facility] Pre-operative Risk Assessment    Request for surgical clearance:  1. What type of surgery is being performed?   Excision of Oral Lesion   2. When is this surgery scheduled?   TBD  3. What type of clearance is required (medical clearance vs. Pharmacy clearance to hold med vs. Both)?  MEDICAL  4. Are there any medications that need to be held prior to surgery and how long? N/A   5. Practice name and name of physician performing surgery?  Dr. Laurance Flatten / [facility]    6. What is your office phone number [phone]    7.   What is your office fax number [phone]  8.   Anesthesia type (None, local, MAC, general) ?  ANESTHESIA FOR ABOUT 30 MINUTES   Jeanann Lewandowsky 11/11/2019, 1:22 PM  _________________________________________________________________   (provider comments below)

## 2019-11-11 NOTE — Patient Instructions (Addendum)
Medication Instructions:  Your physician recommends that you continue on your current medications as directed. Please refer to the Current Medication list given to you today.  *If you need a refill on your cardiac medications before your next appointment, please call your pharmacy*  Lab Work: None ordered  If you have labs (blood work) drawn today and your tests are completely normal, you will receive your results only by: Marland Kitchen MyChart Message (if you have MyChart) OR . A paper copy in the mail If you have any lab test that is abnormal or we need to change your treatment, we will call you to review the results.  Testing/Procedures: Your physician has requested that you have an echocardiogram. Echocardiography is a painless test that uses sound waves to create images of your heart. It provides your doctor with information about the size and shape of your heart and how well your heart's chambers and valves are working. This procedure takes approximately one hour. There are no restrictions for this procedure.    Follow-Up: At [facility], you and your health needs are our priority.  As part of our continuing mission to provide you with exceptional heart care, we have created designated Provider Care Teams.  These Care Teams include your primary Cardiologist (physician) and Advanced Practice Providers (APPs -  Physician Assistants and Nurse Practitioners) who all work together to provide you with the care you need, when you need it.  Your next appointment:     The format for your next appointment:   In Person  Provider:   Jenne Campus, MD  Other Instructions  Echocardiogram An echocardiogram is a procedure that uses painless sound waves (ultrasound) to produce an image of the heart. Images from an echocardiogram can provide important information about:  Signs of coronary artery disease (CAD).  Aneurysm detection. An aneurysm is a weak or damaged part of an artery wall that bulges  out from the normal force of blood pumping through the body.  Heart size and shape. Changes in the size or shape of the heart can be associated with certain conditions, including heart failure, aneurysm, and CAD.  Heart muscle function.  Heart valve function.  Signs of a past heart attack.  Fluid buildup around the heart.  Thickening of the heart muscle.  A tumor or infectious growth around the heart valves. Tell a health care provider about:  Any allergies you have.  All medicines you are taking, including vitamins, herbs, eye drops, creams, and over-the-counter medicines.  Any blood disorders you have.  Any surgeries you have had.  Any medical conditions you have.  Whether you are pregnant or may be pregnant. What are the risks? Generally, this is a safe procedure. However, problems may occur, including:  Allergic reaction to dye (contrast) that may be used during the procedure. What happens before the procedure? No specific preparation is needed. You may eat and drink normally. What happens during the procedure?   An IV tube may be inserted into one of your veins.  You may receive contrast through this tube. A contrast is an injection that improves the quality of the pictures from your heart.  A gel will be applied to your chest.  A wand-like tool (transducer) will be moved over your chest. The gel will help to transmit the sound waves from the transducer.  The sound waves will harmlessly bounce off of your heart to allow the heart images to be captured in real-time motion. The images will be recorded on  a computer. The procedure may vary among health care providers and hospitals. What happens after the procedure?  You may return to your normal, everyday life, including diet, activities, and medicines, unless your health care provider tells you not to do that. Summary  An echocardiogram is a procedure that uses painless sound waves (ultrasound) to produce an  image of the heart.  Images from an echocardiogram can provide important information about the size and shape of your heart, heart muscle function, heart valve function, and fluid buildup around your heart.  You do not need to do anything to prepare before this procedure. You may eat and drink normally.  After the echocardiogram is completed, you may return to your normal, everyday life, unless your health care provider tells you not to do that. This information is not intended to replace advice given to you by your health care provider. Make sure you discuss any questions you have with your health care provider. Document Released: 10/26/2000 Document Revised: 02/19/2019 Document Reviewed: 12/01/2016 Elsevier Patient Education  2020 [employer].

## 2019-11-11 NOTE — Progress Notes (Signed)
Cardiology Office Note:    Date:  11/11/2019   ID:  [name], DOB [birth year], MRN [id number]  PCP:  Myrlene Broker, MD  Cardiologist:  Jenne Campus, MD    Referring MD: Myrlene Broker, MD   No chief complaint on file. I need surgery  History of Present Illness:    [name] is a 85 y.o. female hospital history significant for Takotsubo cardiomyopathy, 2010 she did have a cardiac catheterization done which showed nonobstructive lesions.  She did have significant left ventricle ejection fraction diminished however the recent echocardiogram showed normalization.  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  She was sent to [place] for evaluation before the surgery.  She look very fragile and she is being looking that way for years already.  She does not walk much there is no way to evaluate her objectively.  I think the minimum what we need to do is to obtain echocardiogram.  Past Medical History:  Diagnosis Date  . Anemia    "when she was a child"  . Anxiety   . Cough    "chronic cough"  . GERD (gastroesophageal reflux disease)   . Heart murmur    in the past not sure if recent  . History of acute bronchitis   . HTN (hypertension)   . Hx of bladder infections   . Myocardial infarction [name])    "heart attack from Takotsubo no damage according to cath" in 2010  . Peripheral neuropathy   . Seizure disorder ([place])   . Seizures ([place])    last seizure 2010  . Takotsubo cardiomyopathy     Past Surgical History:  Procedure Laterality Date  . ABDOMINAL HYSTERECTOMY    . BREAST LUMPECTOMY Left    benign  . BREAST LUMPECTOMY WITH RADIOACTIVE SEED LOCALIZATION Right 01/23/2017   Procedure: RADIOACTIVE SEED GUIDED RIGHT BREAST LUMPECTOMY;  Surgeon: Rolm Bookbinder, MD;  Location: [facility];  Service: General;  Laterality: Right;  . CARDIAC CATHETERIZATION   2010   No significant CAD. stress induced cardiomyopathy.   . CARPAL TUNNEL RELEASE Bilateral    2014  . CATARACT EXTRACTION W/ INTRAOCULAR LENS IMPLANT Bilateral   . CHOLECYSTECTOMY    . LAPAROSCOPIC ABDOMINAL EXPLORATION    . TONSILLECTOMY      Current Medications: Current Meds  Medication Sig  . acetaminophen (TYLENOL) 500 MG tablet Take 1,000 mg by mouth every 6 (six) hours as needed (for pain/headaches.).   Marland Kitchen albuterol (PROVENTIL) (2.5 MG/3ML) 0.083% nebulizer solution Take 2.5 mg by nebulization every 6 (six) hours as needed for wheezing.   Marland Kitchen alendronate (FOSAMAX) 70 MG tablet Take 1 tablet by mouth every 7 (seven) days. Pt has not started taking  . bethanechol (URECHOLINE) 10 MG tablet Take 10 mg by mouth daily with breakfast.   . budesonide-formoterol (SYMBICORT) 80-4.5 MCG/ACT inhaler Inhale 2 puffs into the lungs daily.   . Carboxymethylcellulose Sod PF (REFRESH PLUS) 0.5 % SOLN Place 1 drop into both eyes 4 (four) times daily as needed (dry eyes).   . Carboxymethylcellulose Sodium (REFRESH LIQUIGEL) 1 % GEL Place 1 application into both eyes See admin instructions. Instill under eyelid of both eyes every other night  . carvedilol (COREG) 3.125 MG tablet TAKE 1 TABLET BY MOUTH TWICE DAILY WITH A MEAL  . Cholecalciferol (VITAMIN D3) 2000 units TABS Take 2,000 Units by mouth daily with  breakfast.   . clonazePAM (KLONOPIN) 0.5 MG tablet Take 1 tablet (0.5 mg total) by mouth 2 (two) times daily with a meal. To prevent breakthrough seizures  . clotrimazole-betamethasone (LOTRISONE) cream Apply 1 application topically 2 (two) times daily as needed (for vaginal discomfort).   . donepezil (ARICEPT) 5 MG tablet Take 1 tablet (5 mg total) by mouth daily with supper.  . esomeprazole (NEXIUM) 40 MG capsule Take 40 mg by mouth daily with breakfast.   . estradiol (ESTRACE) 0.1 MG/GM vaginal cream Place 1 Applicatorful vaginally at bedtime as needed (for vaginal discomfort (dryness/itchy)).   Marland Kitchen  GLUCOSAMINE-CHONDROITIN PO Take 1 tablet by mouth 2 (two) times daily with a meal.  . hydrocortisone (ANUSOL-HC) 2.5 % rectal cream Place 1 application rectally 2 (two) times daily as needed for hemorrhoids or itching.   . hydrocortisone cream 1 % Apply topically 2 (two) times daily.  Marland Kitchen ipratropium-albuterol (DUONEB) 0.5-2.5 (3) MG/3ML SOLN Take 3 mLs by nebulization 3 (three) times daily for 5 days.  Marland Kitchen levETIRAcetam (KEPPRA) 750 MG tablet Take 750 mg by mouth 2 (two) times daily with a meal. For seizures  . levocetirizine (XYZAL) 5 MG tablet Take 5 mg by mouth daily with breakfast. Once a day as needed   . memantine (NAMENDA) 5 MG tablet Take 1 tablet (5 mg total) by mouth daily with supper.  . montelukast (SINGULAIR) 5 MG chewable tablet Chew 5 mg by mouth daily with supper.   . Multiple Vitamin (MULTIVITAMIN WITH MINERALS) TABS tablet Take 1 tablet by mouth daily with supper.  . Multiple Vitamins-Minerals (PRESERVISION AREDS 2 PO) Take 1 capsule by mouth 2 (two) times daily with a meal.   . Omega-3 Fatty Acids (FISH OIL) 1000 MG CAPS Take 1,000 mg by mouth 2 (two) times daily with a meal.  . PARoxetine (PAXIL) 10 MG tablet Take 1 tablet (10 mg total) by mouth daily at 2 PM.  . pravastatin (PRAVACHOL) 20 MG tablet TAKE 1 TABLET BY MOUTH ONCE DAILY AT  6PM  . pregabalin (LYRICA) 50 MG capsule Take 1 capsule (50 mg total) by mouth 2 (two) times daily with a meal. (Patient taking differently: Take 50 mg by mouth daily. )  . Probiotic Product (PROBIOTIC FORMULA) CAPS Take 1 capsule by mouth daily with breakfast.   . silodosin (RAPAFLO) 4 MG CAPS capsule Take 4 mg by mouth daily with breakfast.   . telmisartan (MICARDIS) 40 MG tablet Take 40 mg by mouth daily with breakfast.   . tiotropium (SPIRIVA) 18 MCG inhalation capsule Place 18 mcg into inhaler and inhale at bedtime.     Allergies:   Levaquin [levofloxacin], Other, Codeine, Penicillins, Promethazine, Sulfa antibiotics, Sulfasalazine, and Tape     Social History   Socioeconomic History  . Marital status: Widowed    Spouse name: Not on file  . Number of children: 3  . Years of education: Not on file  . Highest education level: Not on file  Occupational History  . Occupation: retired  Tobacco Use  . Smoking status: Never Smoker  . Smokeless tobacco: Never Used  Substance and Sexual Activity  . Alcohol use: No  . Drug use: No  . Sexual activity: Not on file  Other Topics Concern  . Not on file  Social History Narrative  . Not on file   Social Determinants of Health   Financial Resource Strain:   . Difficulty of Paying Living Expenses: Not on file  Food Insecurity:   . Worried About  Running Out of Food in the Last Year: Not on file  . Ran Out of Food in the Last Year: Not on file  Transportation Needs:   . Lack of Transportation (Medical): Not on file  . Lack of Transportation (Non-Medical): Not on file  Physical Activity:   . Days of Exercise per Week: Not on file  . Minutes of Exercise per Session: Not on file  Stress:   . Feeling of Stress : Not on file  Social Connections:   . Frequency of Communication with Friends and Family: Not on file  . Frequency of Social Gatherings with Friends and Family: Not on file  . Attends Religious Services: Not on file  . Active Member of Clubs or Organizations: Not on file  . Attends Archivist Meetings: Not on file  . Marital Status: Not on file     Family History: The patient's family history includes Arthritis in her mother; Cancer in her mother and another family member; Heart attack in her father and another family member; Heart failure in an other family member. ROS:   Please see the history of present illness.    All 14 point review of systems negative except as described per history of present illness  EKGs/Labs/Other Studies Reviewed:      Recent Labs: No results found for requested labs within last 8760 hours.  Recent Lipid Panel No results  found for: CHOL, TRIG, HDL, CHOLHDL, VLDL, LDLCALC, LDLDIRECT  Physical Exam:    VS:  BP 116/60   Pulse 66   Ht 4\' 11"  (1.499 m)   Wt 123 lb (55.8 kg)   SpO2 96%   BMI 24.84 kg/m     Wt Readings from Last 3 Encounters:  11/11/19 123 lb (55.8 kg)  08/03/18 134 lb (60.8 kg)  07/03/18 139 lb (63 kg)     GEN:  Well nourished, well developed in no acute distress HEENT: Normal NECK: No JVD; No carotid bruits LYMPHATICS: No lymphadenopathy CARDIAC: RRR, no murmurs, no rubs, no gallops RESPIRATORY:  Clear to auscultation without rales, wheezing or rhonchi  ABDOMEN: Soft, non-tender, non-distended MUSCULOSKELETAL:  No edema; No deformity  SKIN: Warm and dry LOWER EXTREMITIES: no swelling NEUROLOGIC:  Alert and oriented x 3 PSYCHIATRIC:  Normal affect   ASSESSMENT:    1. Preoperative clearance   2. Takotsubo cardiomyopathy   3. Essential hypertension   4. Chronic obstructive pulmonary disease, unspecified COPD type ([place])    PLAN:    In order of problems listed above:  1. Preoperative clearance for this very fragile lady with history of Takotsubo cardiomyopathy with normalization of left ventricle ejection fraction, minimal coronary disease based on cardiac catheterization from 2010.  We will schedule to have echocardiogram based on echocardiogram will decide what will be the risk for surgery regardless because of her fragility I think risk will be at least moderate.  Again the final decision will be made based on results of her echocardiogram. 2. History of Takotsubo cardiomyopathy plan to do echocardiogram 3. Essential hypertension blood pressure controlled 4. COPD stable.   Medication Adjustments/Labs and Tests Ordered: Current medicines are reviewed at length with the patient today.  Concerns regarding medicines are outlined above.  Orders Placed This Encounter  Procedures  . EKG 12-Lead  . ECHOCARDIOGRAM COMPLETE   Medication changes: No orders of the defined types  were placed in this encounter.   Signed, Park Liter, MD, [facility] 11/11/2019 1:46 PM    [facility]

## 2019-11-11 NOTE — Telephone Encounter (Signed)
Pt is having to have some testing done 11/16/2019.  We will get those results and make a decision based upon those and will let your office know.

## 2019-11-16 ENCOUNTER — Other Ambulatory Visit (HOSPITAL_BASED_OUTPATIENT_CLINIC_OR_DEPARTMENT_OTHER): Payer: Medicare Other

## 2019-11-17 ENCOUNTER — Other Ambulatory Visit: Payer: Self-pay

## 2019-11-17 ENCOUNTER — Ambulatory Visit (HOSPITAL_BASED_OUTPATIENT_CLINIC_OR_DEPARTMENT_OTHER)
Admission: RE | Admit: 2019-11-17 | Discharge: 2019-11-17 | Disposition: A | Discharge status: Home / Self Care | Payer: Medicare Other | Source: Ambulatory Visit | Attending: Cardiology | Admitting: Cardiology

## 2019-11-17 DIAGNOSIS — I10 Essential (primary) hypertension: Secondary | ICD-10-CM | POA: Diagnosis present

## 2019-11-17 DIAGNOSIS — I5181 Takotsubo syndrome: Secondary | ICD-10-CM | POA: Insufficient documentation

## 2019-11-17 DIAGNOSIS — Z01818 Encounter for other preprocedural examination: Secondary | ICD-10-CM | POA: Insufficient documentation

## 2019-11-17 MED ORDER — PERFLUTREN LIPID MICROSPHERE
1.0000 mL | INTRAVENOUS | Status: AC | PRN
  Administered 2019-11-17: 2 mL via INTRAVENOUS
  Filled 2019-11-17: qty 10

## 2019-11-17 NOTE — Progress Notes (Signed)
  Echocardiogram 2D Echocardiogram has been performed.  [name] 11/17/2019, 2:15 PM

## 2019-11-20 ENCOUNTER — Encounter: Payer: Self-pay | Admitting: Emergency Medicine

## 2019-11-25 ENCOUNTER — Telehealth: Payer: Self-pay | Admitting: Cardiology

## 2019-11-25 NOTE — Telephone Encounter (Signed)
Stated you were supposed to call her and let her know if she was clear for surgery or not

## 2019-11-25 NOTE — Telephone Encounter (Signed)
Please call about echo results

## 2019-11-25 NOTE — Telephone Encounter (Signed)
Called and spoke to patient's daughter. Informed her of echo results again and informed her that the clearance for her mother's upcoming surgery was faxed on 1.8.2021. She verbally understood. No further questions.

## 2020-01-11 DEATH — deceased
